# Patient Record
Sex: Female | Born: 1980 | Race: Black or African American | Hispanic: No | Marital: Married | Smoking: Never smoker
Health system: Southern US, Community
[De-identification: ages and names within clinical notes are randomized; demographics above are authoritative.]

## PROBLEM LIST (undated history)

## (undated) DIAGNOSIS — M549 Dorsalgia, unspecified: Secondary | ICD-10-CM

## (undated) DIAGNOSIS — D649 Anemia, unspecified: Secondary | ICD-10-CM

## (undated) DIAGNOSIS — Z8619 Personal history of other infectious and parasitic diseases: Secondary | ICD-10-CM

## (undated) DIAGNOSIS — B9689 Other specified bacterial agents as the cause of diseases classified elsewhere: Secondary | ICD-10-CM

## (undated) DIAGNOSIS — R300 Dysuria: Secondary | ICD-10-CM

## (undated) DIAGNOSIS — N760 Acute vaginitis: Secondary | ICD-10-CM

## (undated) DIAGNOSIS — O24419 Gestational diabetes mellitus in pregnancy, unspecified control: Secondary | ICD-10-CM

## (undated) DIAGNOSIS — L292 Pruritus vulvae: Secondary | ICD-10-CM

## (undated) HISTORY — DX: Acute vaginitis: N76.0

## (undated) HISTORY — DX: Pruritus vulvae: L29.2

## (undated) HISTORY — DX: Dorsalgia, unspecified: M54.9

## (undated) HISTORY — DX: Personal history of other infectious and parasitic diseases: Z86.19

## (undated) HISTORY — DX: Dysuria: R30.0

## (undated) HISTORY — PX: OTHER SURGICAL HISTORY: SHX169

## (undated) HISTORY — DX: Other specified bacterial agents as the cause of diseases classified elsewhere: B96.89

## (undated) HISTORY — DX: Gestational diabetes mellitus in pregnancy, unspecified control: O24.419

## (undated) HISTORY — DX: Anemia, unspecified: D64.9

---
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
## 1999-06-19 ENCOUNTER — Inpatient Hospital Stay (HOSPITAL_COMMUNITY): Admission: AD | Admit: 1999-06-19 | Discharge: 1999-06-19 | Payer: Self-pay | Admitting: Obstetrics

## 2000-08-27 ENCOUNTER — Inpatient Hospital Stay (HOSPITAL_COMMUNITY): Admission: AD | Admit: 2000-08-27 | Discharge: 2000-08-27 | Payer: Self-pay | Admitting: *Deleted

## 2000-08-27 ENCOUNTER — Encounter: Payer: Self-pay | Admitting: *Deleted

## 2001-05-14 ENCOUNTER — Encounter: Admission: RE | Admit: 2001-05-14 | Discharge: 2001-05-14 | Payer: Self-pay | Admitting: Obstetrics and Gynecology

## 2001-07-16 ENCOUNTER — Inpatient Hospital Stay (HOSPITAL_COMMUNITY): Admission: AD | Admit: 2001-07-16 | Discharge: 2001-07-19 | Payer: Self-pay | Admitting: Obstetrics and Gynecology

## 2001-09-13 ENCOUNTER — Inpatient Hospital Stay (HOSPITAL_COMMUNITY): Admission: AD | Admit: 2001-09-13 | Discharge: 2001-09-13 | Payer: Self-pay | Admitting: Obstetrics and Gynecology

## 2001-09-14 DIAGNOSIS — B9689 Other specified bacterial agents as the cause of diseases classified elsewhere: Secondary | ICD-10-CM

## 2001-09-14 HISTORY — DX: Other specified bacterial agents as the cause of diseases classified elsewhere: B96.89

## 2007-05-26 ENCOUNTER — Inpatient Hospital Stay (HOSPITAL_COMMUNITY): Admission: AD | Admit: 2007-05-26 | Discharge: 2007-05-26 | Payer: Self-pay | Admitting: Obstetrics and Gynecology

## 2008-04-16 DIAGNOSIS — D649 Anemia, unspecified: Secondary | ICD-10-CM

## 2008-04-16 HISTORY — DX: Anemia, unspecified: D64.9

## 2008-04-20 ENCOUNTER — Encounter: Admission: RE | Admit: 2008-04-20 | Discharge: 2008-04-20 | Payer: Self-pay | Admitting: Obstetrics and Gynecology

## 2008-06-12 ENCOUNTER — Inpatient Hospital Stay (HOSPITAL_COMMUNITY): Admission: AD | Admit: 2008-06-12 | Discharge: 2008-06-12 | Payer: Self-pay | Admitting: Obstetrics and Gynecology

## 2008-06-21 ENCOUNTER — Inpatient Hospital Stay (HOSPITAL_COMMUNITY): Admission: AD | Admit: 2008-06-21 | Discharge: 2008-06-21 | Payer: Self-pay | Admitting: Obstetrics and Gynecology

## 2008-07-15 DIAGNOSIS — O24419 Gestational diabetes mellitus in pregnancy, unspecified control: Secondary | ICD-10-CM

## 2008-07-15 HISTORY — DX: Gestational diabetes mellitus in pregnancy, unspecified control: O24.419

## 2008-08-09 ENCOUNTER — Encounter (INDEPENDENT_AMBULATORY_CARE_PROVIDER_SITE_OTHER): Payer: Self-pay | Admitting: Obstetrics and Gynecology

## 2008-08-09 ENCOUNTER — Inpatient Hospital Stay (HOSPITAL_COMMUNITY): Admission: AD | Admit: 2008-08-09 | Discharge: 2008-08-12 | Payer: Self-pay | Admitting: Obstetrics and Gynecology

## 2008-08-09 HISTORY — PX: TUBAL LIGATION: SHX77

## 2009-11-02 DIAGNOSIS — L292 Pruritus vulvae: Secondary | ICD-10-CM

## 2009-11-02 HISTORY — DX: Pruritus vulvae: L29.2

## 2010-07-26 LAB — CBC
HCT: 33.1 % — ABNORMAL LOW (ref 36.0–46.0)
Hemoglobin: 8.5 g/dL — ABNORMAL LOW (ref 12.0–15.0)
MCHC: 33.5 g/dL (ref 30.0–36.0)
MCV: 91.4 fL (ref 78.0–100.0)
Platelets: 187 10*3/uL (ref 150–400)
RBC: 2.75 MIL/uL — ABNORMAL LOW (ref 3.87–5.11)
RBC: 3.63 MIL/uL — ABNORMAL LOW (ref 3.87–5.11)
WBC: 11 10*3/uL — ABNORMAL HIGH (ref 4.0–10.5)
WBC: 9.9 10*3/uL (ref 4.0–10.5)

## 2010-07-26 LAB — RPR: RPR Ser Ql: NONREACTIVE

## 2010-07-26 LAB — GLUCOSE, CAPILLARY: Glucose-Capillary: 89 mg/dL (ref 70–99)

## 2010-07-27 LAB — URINALYSIS, ROUTINE W REFLEX MICROSCOPIC
Glucose, UA: NEGATIVE mg/dL
Nitrite: NEGATIVE
Specific Gravity, Urine: 1.025 (ref 1.005–1.030)
pH: 6 (ref 5.0–8.0)

## 2010-07-27 LAB — WET PREP, GENITAL
Clue Cells Wet Prep HPF POC: NONE SEEN
Trich, Wet Prep: NONE SEEN
Yeast Wet Prep HPF POC: NONE SEEN

## 2010-08-01 LAB — URINALYSIS, ROUTINE W REFLEX MICROSCOPIC
Bilirubin Urine: NEGATIVE
Hgb urine dipstick: NEGATIVE
Ketones, ur: NEGATIVE mg/dL
Protein, ur: NEGATIVE mg/dL
Specific Gravity, Urine: 1.015 (ref 1.005–1.030)
Urobilinogen, UA: 0.2 mg/dL (ref 0.0–1.0)

## 2010-08-01 LAB — STREP B DNA PROBE: Strep Group B Ag: NEGATIVE

## 2010-08-01 LAB — WET PREP, GENITAL

## 2010-08-01 LAB — FETAL FIBRONECTIN: Fetal Fibronectin: NEGATIVE

## 2010-08-29 NOTE — Op Note (Signed)
Marissa Wagner, Marissa Wagner               ACCOUNT NO.:  [id number]   MEDICAL RECORD NO.:  [id number]          PATIENT TYPE:  INP   LOCATION:  9128                          FACILITY:  [facility]   PHYSICIAN:  Crist Fat. Rivard, M.D. DATE OF BIRTH:  1980/09/02   DATE OF PROCEDURE:  DATE OF DISCHARGE:                               OPERATIVE REPORT   PREOPERATIVE DIAGNOSES:  Intrauterine pregnancy at 38 weeks and 2 days,  previous cesarean section, early labor, desire for sterilization,  gestational diabetes.   POSTOPERATIVE DIAGNOSES:  Intrauterine pregnancy at 38 weeks and 2 days,  previous cesarean section, early labor, desire for sterilization,  gestational diabetes.   ANESTHESIA:  Spinal.   PROCEDURE:  Repeat low transverse cesarean section with bilateral tubal  ligation.   SURGEON:  Crist Fat. Rivard, MD   ASSISTANT:  Renaldo Reel. Emilee Hero, CNM   ESTIMATED BLOOD LOSS:  700 mL.   PROCEDURE:  After being informed of the planned procedure with possible  complications including bleeding, infection, injury to bowel, bladder,  or ureters, as well as irreversibility of tubal ligation and failure  rate of 1 and 500, informed consent was obtained.  The patient was taken  to OR #1, given spinal anesthesia without any complication.  She was  placed in the dorsal decubitus position, pelvis tilted to the left,  prepped and draped in a sterile fashion, and a Foley catheter was  inserted in her bladder.  After assessing adequate level of anesthesia,  we infiltrated the previous incision with 20 mL of Marcaine 0.25 and  performed a Pfannenstiel incision which was brought down sharply to the  fascia.  The fascia was incised in a low transverse fashion.  Linea alba  was dissected.  Peritoneum was entered in the midline fashion.  An  Alexis retractor was placed easily.  At this time, we note significant  adhesion between the lower uterine segment and the bladder, but we were  able to proceed with a higher  incision still remaining on the lower  uterine segment away from the bladder flap.  This incision was done  sharply in a low transverse fashion first with knife, then extended  bluntly.  Amniotic fluid was clear.  We assisted the birth of a female  infant in vertex presentation in ROA at 9:16 a.m.  Mouth and nose were  suctioned.  Baby was delivered while reducing a nuchal cord over the  shoulder.  Cord was clamped with 2 Kelly clamps and sectioned and the  baby was given to Dr. Alison Murray, neonatologist, present in the room.  Blood  10 mL was drawn from the umbilical vein, and the placenta was allowed to  deliver spontaneously.  It was complete.  Cord had 3 vessels, and  uterine revision was negative.   We then proceeded with closure of the myometrium in 2 layers, first with  a running locked suture of 0 Vicryl, then with a Lembert suture of 0  Vicryl imbricating the first one.  Hemostasis was completed with 3  figure-of-eight stitches of 0 Vicryl on the left angle.   Both paracolic gutters  were cleaned.  Both tubes and ovaries assessed  and normal, and the pelvis was profusely irrigated with warm saline to  reveal a satisfactory hemostasis.  We then proceed with tubal ligation.  Starting with the left side, we isolated the tube onto Babcock forceps,  opened the mesosalpinx with cautery, doubly ligated the proximal stump  and doubly ligated the distal stump with 0 chromic.  A section of 1.5 cm  of the isthmic ampullary tube was then resected and both stumps were  cauterized.  Hemostasis was adequate on both sides.   We again verified hemostasis on the uterine incision which was adequate.  Retractors are removed.  Under fascia, hemostasis was completed with  cautery, and the fascia was closed with 2 running sutures of 0 Vicryl  meeting midline.  The wound was irrigated with warm saline.  Hemostasis  was completed with cautery, and the skin was closed with the  subcuticular suture of 3-0  Monocryl and Steri-Strips.   Instrument and sponge count was complete x2.  Estimated blood loss was  700 mL.  The procedure was well tolerated by the patient who was taken  to recovery room in a well and stable condition.   Little girl named, Ajaylah, was born at 9:16 a.m., weighed 5 pounds 10  ounces, and received an Apgar of 8 at 1 minute and 9 at 5 minutes.   SPECIMEN:  Placenta sent to [facility].      Crist Fat Rivard, M.D.  Electronically Signed     SAR/MEDQ  D:  08/09/2008  T:  08/10/2008  Job:  [id number]

## 2010-08-29 NOTE — H&P (Signed)
Marissa Wagner, Marissa Wagner               ACCOUNT NO.:  [id number]   MEDICAL RECORD NO.:  [id number]          PATIENT TYPE:  INP   LOCATION:  9128                          [facility]:  [facility]   PHYSICIAN:  Marissa Wagner, M.D. DATE OF BIRTH:  1980/08/26   DATE OF ADMISSION:  08/09/2008  DATE OF DISCHARGE:                              HISTORY & PHYSICAL   Marissa Wagner is a 30 year old gravida 5, para 1-0-3-1 at 38-2/7 weeks who  presented complaining of uterine contractions since approximately 2 a.m.  She denies leaking or bleeding and reports positive fetal movement.  She  is scheduled for repeat cesarean section and tubal ligation on Aug 14, 2008.  Pregnancy has been remarkable for:   1. Previous cesarean section with desire for repeat and tubal      ligation.  2. Gestational diabetes, diet controlled.  3. Group B strep negative.   PRENATAL LABORATORIES:  Blood type is O+, Rh antibody negative, VDRL  nonreactive, rubella titer positive, hepatitis B surface antigen  negative, HIV is nonreactive, GC and Chlamydia cultures were negative in  October.  Pap was normal in March 2009.  Hemoglobin upon entering the  practice was 12.5.  The patient had a normal first trimester screen.  She had a normal AFP.  She had an early Glucola at 19 weeks secondary to  history of gestational diabetes with her previous pregnancy.  This was  elevated at 143.  She had a 3-hour GTT at that time showing 3/4 abnormal  values.  At that time she was referred [facility]  and given a diagnosis of gestational diabetes..  She had a negative  group B strep noted at 36 weeks.   HISTORY OF PRESENT PREGNANCY:  The patient entered care at approximately  8 weeks.  She had an ultrasound at that time confirming [facility] of Aug 18, 2008 which was congruent with her LMP dating.  She was on some Phenergan  for nausea in early pregnancy.  She did have a first trimester screen  which was normal.  She had an AFP that  was normal.  She had an early  Glucola at 19 weeks secondary to history of gestational diabetes.  This  was elevated at 143 and she had 3/4 abnormal values on 3-hour GTT.  She  also had an ultrasound at 19 weeks showing normal growth and fluid with  normal cervical length.  From the early part of her pregnancy she was  determined to desire a repeat cesarean section.  She also wanted to have  a tubal at the same time.  The patient began to check her blood sugars  at approximately 23 weeks.  Fastings remained within normal limits and  her 2-hour p.c.'s were less than 120.  She had another ultrasound at 29  weeks with normal cervical length and fluid and growth at the 67th  percentile.  She was able to start checking her blood sugars every other  day by 29 weeks secondary to normal values.  She was seen for  contractions and questionable leaking at 29 weeks.  From that visit she  was noted to not be leaking, but she was having some preterm  contractions.  She was placed on Procardia p.r.n. but did not have to  take this very much.  Group B strep culture was done at 36 weeks and was  negative.  The patient was then scheduled for C-section on Aug 14, 2008  with a repeat with a tubal ligation.   OBSTETRICAL HISTORY:  In 2002 she had a miscarriage that resolved  spontaneously.  In 2003 she had a primary low transverse cesarean  section for a female infant, weight 6 pounds 1 ounce at 38 weeks.  She  was in labor for an unknown period of time.  This was apparently done  for failure to progress.  She had no complications.  She had gestational  diabetes with that pregnancy.  In 2004 she had a termination of  pregnancy.  In January 2009 she had a spontaneous miscarriage.  She was  positive for group B strep also with her second pregnancy.   MEDICAL HISTORY:  She was a previous Ortho Tri-Cyclen user, Ortho Evra  patch user.  Her Paps were normal.  She has no medication allergies.  She had a broken  arm and had surgery in 1992.  She had a C-section in  2003 and a D and C in 2004.   FAMILY HISTORY:  Her maternal grandmother had a heart attack, maternal  aunts have hypertension.  Maternal aunts are on oral meds for diabetes.  Maternal aunt had a stroke.  Maternal grandmother had breast cancer.  Maternal aunt has had breast cancer.  Genetic history is remarkable for  the patient's sister with a heart murmur.   SOCIAL HISTORY:  The patient is married to the father of the baby.  He  is involved and supportive.  His name is Murl Zogg, Montez Hageman.  The patient  is high school educated.  She is in customer service.  Her husband has  some college.  He is in Airline pilot at [employer].  She has been followed by the  physician service [facility].  She denies any alcohol, drug or  tobacco use during this pregnancy.   PHYSICAL EXAMINATION:  VITAL SIGNS:  Stable.  The patient is febrile.  HEENT:  Within normal limits.  LUNGS:  The breath sounds are clear.  HEART:  Regular rate and rhythm without murmur.  BREASTS:  Soft and nontender.  ABDOMEN:  Fundal height is approximately 38 -cm, estimated fetal weight  is 6-7 pounds.  Uterine contractions are every 3-4 minutes, mild to  moderate quality.  Cervical exam initially was 1-2 cm, 90% vertex, was  noted to be high.  Reevaluation approximately 1 to 1-1/2 hours later  showed 2 cm, 90%, with the presenting part out of the pelvis.  Vertex  presentation was verified by bedside ultrasound.  Fetal heart rate has  currently been reactive.  There have been cycles of mild variables with  contractions.  EXTREMITIES:  Deep tendon reflexes are 2+ without clonus.  There is a  trace edema noted.   IMPRESSION:  1. Intrauterine pregnancy at 38-2/7 weeks.  2. Early labor.  3. For repeat cesarean section and tubal sterilization on Aug 14, 2008.      Will plan to do this today.  4. Group B strep negative.   PLAN:  1. Admit to [facility] per consult  with Dr. Estanislado Pandy as      attending physician.  2.  Routine physician preoperative orders.  3. Reviewed risks and benefits of C-section and tubal ligation      including bleeding, infection, damage to other organs and failure      of tubal ligation.  The patient seems to understand these risks and      wished to proceed.      Renaldo Reel Emilee Hero, C.N.M.      Marissa Fat Wagner, M.D.  Electronically Signed    VLL/MEDQ  D:  08/09/2008  T:  08/09/2008  Job:  [id number]

## 2010-09-01 NOTE — Discharge Summary (Signed)
NAMECLARYSSA, SANDNER               ACCOUNT NO.:  [id number]   MEDICAL RECORD NO.:  [id number]          PATIENT TYPE:  INP   LOCATION:  9128                          [facility]ACILITY:  [facility]   PHYSICIAN:  Hal Morales, M.D.DATE OF BIRTH:  Sep 02, 1980   DATE OF ADMISSION:  08/09/2008  DATE OF DISCHARGE:  08/12/2008                               DISCHARGE SUMMARY   HISTORY OF PRESENT ILLNESS:  Ms. Loughridge is a 30 year old gravida 5, and  now para 2-0-3-2 who presented in active labor at 38-1/2 weeks and was  provided with a repeat cesarean and bilateral tubal ligation for the  birth of her daughter, Quay Burow on August 09, 2008 and she weighed 5 pounds  10 ounces and had Apgar of 8 at 1 minute and 9 at 5 minutes and was  delivered by Dr. Estanislado Pandy.   ADMISSION DIAGNOSES:  1. Intrauterine pregnancy at 38 weeks.  2. Previous cesarean.  3. Gestational diabetes mellitus.   DISCHARGE DIAGNOSES:  1. Repeat cesarean.  2. Bilateral tubal ligation.  3. Gestational diabetes mellitus.  4. Anemia.   PERTINENT LABORATORY DATA:  Ms. Armitage's blood type is O+.  She is  rubella immune.  She had a negative GBS status.  WBC 11.0 ( 9.9), HGB  8.5 (11.3), HCT 25.4 (33.1), PLT 150 (187).  Ms. Agyeman's fasting  glucose on the first postoperative day was 89.   PROCEDURES:  Ms. Lensing had been scheduled for a repeat cesarean and  bilateral tubal ligation on Aug 14, 2008; however, she presented on August 09, 2008 with regular uterine contractions and a cervix that was dilated  1-2 cm, 90% effaced and then changed to 2 cm dilation.  She was given  spinal anesthesia and a repeat low transverse cesarean section as well  as a bilateral tubal ligation with tissue from the right and left  fallopian tube sent to Pathology for analysis.  Ms. Leppla proceeded  well on the clinical pathway of care for postoperative patient and was  discharge on day 3, without any dizziness, and no problems voiding.  She  did have some trapped  gas in her abdomen.   PHYSICAL EXAMINATION:  VITAL SIGNS:  Her vital signs on day of discharge  were 97.5, pulse 78, respiratory rate 18, blood pressure 103/63, and O2  saturation 99% on room air.  LUNGS:  Clear to auscultation bilaterally.  HEART:  Regular rate and rhythm.  No murmur.  BREASTS:  Soft.  Nipples intact.  EXTREMITIES:  No edema of upper extremities, +1 edema of lower  extremities.  Normal DTRs.  Negative Homans x2.  ABDOMEN:  Her abdomen was noticeably distended, however, the fundus was  firm and 2 cm below the umbilicus.  PELVIC:  The uterine incision and Steri-Strips were intact with good  approximation.  There was light lochia from vagina.   She had stable orthostatic blood pressures and pulses, despite her  hemoglobin of 8.5 postoperatively.  She was provided with and reviewed  the discharge instruction booklet with special emphasis on the danger  signs of the postpartum and postoperative period.  DISCHARGE MEDICATIONS:  She was given prescriptions and her discharge  medications include.  1. Prenatal vitamins.  2. Iron.  3. Motrin 600 mg.  4. Percocet.  5. Over-the-counter stool softeners and/or fiber supplementation.   Constipation prevention was reviewed with Ms. Alferd Apa and she was  encouraged to consider taking a rectal suppository to help her with her  gas.  Her bowel movements good for discharge.  Ms. Strubel was discharged  in stable condition in good spirits and was deemed to have received full  benefit for the hospitalization of the birth of her daughter, Quay Burow on  August 09, 2008.      Eulogio Bear, CNM      Hal Morales, M.D.  Electronically Signed    JM/MEDQ  D:  08/14/2008  T:  08/14/2008  Job:  [id number]

## 2010-09-01 NOTE — Discharge Summary (Signed)
[facility]  Patient:    Marissa Wagner, Marissa Wagner Visit Number: [id number] MRN: [id number]          Service Type: OBS Location: 910A [id number] [id number] Attending Physician:  Shaune Spittle Dictated by:   Marcelle Smiling Clelia Croft, C.N.M. Admit Date:  07/16/2001 Discharge Date: 07/19/2001                             Discharge Summary  DATE OF BIRTH:                Oct 16, 1980.  ADMITTING DIAGNOSES:          1. Term intrauterine pregnancy.                               2. Nonreassuring fetal heart rate tracing.  DISCHARGE DIAGNOSES:          1. Term intrauterine pregnancy.                               2. Nonreassuring fetal heart rate tracing.                               3. Occiput posterior position.                               4. Nuchal cord.  PROCEDURE:                    1. Primary low transverse cesarean section.                               2. Epidural anesthesia.  HOSPITAL COURSE:              [name] is a 30 year old, gravida 2, para 0-0-1-0, at 38-6/7 weeks who was admitted for advanced cervical change with irregular uterine contractions and questionable prolonged leaking. This was on July 16, 2001. Her pregnancy has been remarkable for (1) positive group B strep, (2) diet-controlled gestational diabetes, (3) questionable last menstrual period. After admission, her membranes were artificially ruptured for clear fluid but foul odor present. Internal monitors were placed per Dr. Stefano Gaul. Her cervix continued to remain at 6 cm, which was unchanged, throughout the day until approximately 1 p.m. when fetal heart rate decelerations were present as well as tachycardia. A C-section was recommended, risks and benefits were reviewed and the decision was made to proceed with cesarean section. The patient tolerated the procedure well with the delivery of a viable female infant. Weight was 6 pounds 1 ounce. Apgars were 8 and 9. Infants name was Anniah  from an occiput posterior position. The infant was taken to the full-term nursery. Mom was taken to recovery in good condition.  By postoperative day #1, the patient was doing well. Physical exam was within normal limits. Incision was clean, dry and intact. The patients hemoglobin was 9.4; it had been 11.2 predelivery. She was tolerating a regular diet and the rest of the postoperative stay was unremarkable. She was bottle feeding and planned Ortho Evra patch for contraception. By postoperative day #3, she was deemed to have received the benefit of her hospital stay and  was discharged home.  DISCHARGE INSTRUCTIONS:       Instructions per [facility] handout.  DISCHARGE MEDICATIONS:        1. Motrin 600 mg p.o. q.6h. p.r.n.                               2. Tylox one to two p.o. q.3-4h. p.r.n.  DISCHARGE FOLLOWUP:           The patient is to follow up in six weeks at [facility]. Dictated by:   Marcelle Smiling Clelia Croft, C.N.M. Attending Physician:  Shaune Spittle DD:  07/19/01 TD:  07/20/01 Job: [id number]

## 2010-09-01 NOTE — Op Note (Signed)
[facility]  Patient:    Marissa Wagner, Marissa Wagner Visit Number: [id number] MRN: [id number]          Service Type: Attending:  Janine Limbo, M.D. Dictated by:   Janine Limbo, M.D. Proc. Date: 07/16/01                             Operative Report  PREOPERATIVE DIAGNOSES:       1. Term intrauterine pregnancy.                               2. Nonreassuring fetal heart rate tracing.  POSTOPERATIVE DIAGNOSES:      1. Term intrauterine pregnancy.                               2. Nonreassuring fetal heart rate tracing.                               3. Occiput posterior presentation.                               4. Nuchal cord.  PROCEDURE:                    Primary low transverse cesarean section.  SURGEON:                      Janine Limbo, M.D.  FIRST ASSISTANT:              Mack Guise, C.N.M.  ANESTHESIA:                   Epidural.  DISPOSITION:                  [name] is a 30 year old female gravida 2, para 0-0-1-0 who presents at term.  She labored and dilated her cervix to 6 cm.  Pitocin was started.  She was noted to have multiple decelerations.  An amnioinfusion was given but the decelerations did not resolve.  The patient then began having fetal tachycardia with a baseline fetal heart rate of approximately 180-190 beats per minute.  The patients cervix had stopped dilating at 6 cm.  The decision was made to recommend cesarean section.  The patient agreed with that recommendation.  The patient understood the indications for her procedure and she accepted the risks of, but not limited to, anesthetic complications, bleeding, infections, and possible damage to the surrounding organs.  FINDINGS:                     A 6 pound 1 ounce female infant (Anaya) was delivered from an occiput posterior presentation.  The Apgars were 8 at one minute and 9 at five minutes.  There was a nuchal cord present.  The uterus, fallopian  tubes, and the ovaries were normal for the gravid state.  PROCEDURE:                    The patient was taken to the operating room where it was determined that the epidural she had received for labor would be adequate for cesarean section.  A Foley catheter had previously been placed.  The patients abdomen was prepped with multiple layers of Betadine and then sterilely draped.  A low transverse incision was made in the abdomen and carried sharply through the subcutaneous tissue, the fascia, and the anterior peritoneum.  An incision was made in the lower uterine segment and extended transversely.  The fetal head was delivered without difficulty.  The mouth and nose were suctioned.  The remainder of the infant was delivered.  The cord was clamped and cut and the infant was handed to the awaiting pediatric team. Routine cord blood studies were obtained.  The placenta was removed.  The uterine cavity was cleaned of amniotic fluid, clotted blood, and membranes. Uterine cavity was irrigated.  The incision was closed using a running locking suture of 2-0 Vicryl.  The pericolonic gutters were cleaned of amniotic fluid and clotted blood.  Hemostasis was noted to be adequate throughout.  The peritoneal cavity was again vigorously irrigated.  All instruments were removed.  The anterior peritoneum and the abdominal musculature were reapproximated in the midline using 2-0 Vicryl.  The fascia was closed using a running suture of 0 Vicryl followed by three interrupted sutures of 3-0 Vicryl.  The subcutaneous layer was irrigated.  Hemostasis was adequate.  The subcutaneous layer was closed using a running suture of 2-0 Vicryl.  The skin was reapproximated using skin staples.  Sponge, needle, and instrument counts were correct on two occasions.  The estimated blood loss for the procedure was 700 cc.  The patient tolerated her procedure well.  The patient was taken to the recovery room in stable condition.   The infant was taken to the full-term nursery in stable condition. Dictated by:   Janine Limbo, M.D. Attending:  Janine Limbo, M.D. DD:  07/16/01 TD:  07/16/01 Job: [id number]

## 2010-09-01 NOTE — H&P (Signed)
[facility]  Patient:    Marissa Wagner, Marissa Wagner Visit Number: [id number] MRN: [id number]          Service Type: Attending:  Maris Berger. Pennie Rushing, M.D. Dictated by:   Nigel Bridgeman, C.N.M. Adm. Date:  07/16/01                           History and Physical  CHIEF COMPLAINT:              [name] is a 30 year old gravida 2 para 0, 0-1-0, at 38-6/7th weeks, who presents with questionable leaking and gush of fluid at approximately 3:30 a.m.  HISTORY OF PRESENT ILLNESS:   She reports irregular uterine contractions but less uncomfortable than previously over the last couple of days.  She has had several days of gradually increasing contractions.  Reports positive fetal movement.  Does report increased mucus discharge the last several days. Pregnancy has been remarkable for:                               1. Positive group B strep.                               2. Gestational diabetes, diet controlled, but                                  she has not checked her CBGs in approximately                                  two to three weeks secondary to "being also                                  low and my fingers hurting."                               3. Questionable last menstrual period, but                                  dating by 13 week ultrasound.  PRENATAL LABORATORY DATA:     Blood type is O-positive, Rh antibody negative. VDRL nonreactive.  Rubella titer positive.  Hepatitis B surface antigen negative.  HIV nonreactive.  Sickle cell test negative.  Pap smear normal. Initial glucose challenge was elevated, three hour GTT was also abnormal.  AFP was normal.  Hemoglobin upon entry into practice was 12, at 26 weeks was 10. EDC of July 24, 2001 was established by 13 week ultrasound and was in agreement with ultrasound at approximately 18 weeks.  Group B strep culture was positive at 36 weeks.  HISTORY OF PRESENT PREGNANCY: The patient entered care at  approximately 14 weeks.  She had had a history of ascus on Pap smear in June 2002.  Pap smear with HPV typing was done, which was normal and negative.  She had an ultrasound at 13 weeks and then had another ultrasound at 18 weeks, with normal findings.  She originally had planned CNM management; however, she  had an elevated one hour glucola and an abnormal three hour GTT.  She was referred to [facility] diet teaching and transferred to the physician service.  As of 33 weeks she had not done any CBGs and need to manage her blood sugar carefully was reviewed with the patient.  She had an ultrasound at 33 weeks which showed normal growth and fluid.  That was her only ultrasound in late pregnancy.  By the end of 35 weeks her fasting blood sugars were running between 65-75, two hour p.c.s were between 72-156.  Group B strep culture was positive.  She reports that she had not checked her blood sugar in the last two to three weeks.  PAST MEDICAL HISTORY:         1. She was on Ortho Tri-Cyclen until March 2002.                               2. She had ascus on Pap smear in June 2002 but                                  then had a normal Pap smear at her new OB                                  visit.                               3. She has had occasional yeast infections.                               4. Reports the usual childhood illnesses.                               5. At age 68 she broke her right arm.  ALLERGIES:                    No known medication allergies.  FAMILY HISTORY:               Maternal aunt had hypertension.  Paternal grandmother had insulin-dependent diabetes.  Maternal aunt had lung cancer. Maternal aunt also had a stroke.  GENETIC HISTORY:              Remarkable for the patients sister having a heart murmur.  SOCIAL HISTORY:               The patient is engaged to the father of the baby.  He is involved and supportive.  His name  is Bettyjane Shenoy, Montez Hageman.  The patient is high school educated.  She is employed in Clinical biochemist.  Her partner has one year of college and is employed with [employer].  She is African-American and of the [place] faith.  She originally was followed by the CNM service at [facility] but then was transferred to the physician service secondary to diagnosis of gestational diabetes.  She denies any alcohol, drug, or tobacco use during this pregnancy.  PHYSICAL EXAMINATION:  VITAL SIGNS:  Temperature 99.4 degrees.  Other vital signs are stable.  HEENT:                        Within normal limits.  LUNGS:                        Bilateral breath sounds clear.  HEART:                        Regular rate and rhythm without murmur.  BREAST:                       Soft, nontender.  ABDOMEN:                      Fundal height approximately 38 cm.  Abdomen is soft and nontender.  Electronic fetal monitoring reveals reassuring fetal heart rate with negative spontaneous NST, but there is noted to be decreased variability on external monitor.  There was a positive response to scalp stim during VE.  Uterine contractions are irregular, every two to six minutes; mild quality.  The patient was unaware of many uterine contractions.  PELVIC:                       Speculum examination showed positive frothy fluid in the vagina with a very slight questionable foul odor.  Nitrazine was positive.  Crist Fat was negative but there were multiple rbcs and wbcs on the fern slide, with possibility of obscuring ferning on the sample.  Cervix is 6 cm, 100%, vertex at -1 to 0 station, with bulging forewaters noted.  EXTREMITIES:                  Deep tendon reflexes are 2+ without clonus. There is trace edema noted.  IMPRESSION:                   1. Intrauterine pregnancy at 38-6/7th weeks.                               2. Advanced cervical change with irregular                                   uterine contraction quality and frequency.                               3. Questionable prolonged leaking.                                4. Gestational diabetes, diet controlled but                                  poor capillary blood glucose monitoring                                  history.  5. Positive group B streptococci.  PLAN:                         1. Admit to birthing suite per consult with                                  Dr. Dierdre Forth, attending physician.                               2. Routine physician orders.                               3. Plan serum glucose with admit laboratories.                                  If greater than 90 plan implementation of                                  gestational diabetes protocol.  If less                                  than 90, no separate orders are noted or                                  needed.                               4. Penicillin G per standard dosing. Dictated by:   Nigel Bridgeman, C.N.M. Attending:  Maris Berger. Pennie Rushing, M.D. DD:  07/16/01 TD:  07/16/01 Job: [id number]

## 2011-01-05 LAB — GC/CHLAMYDIA PROBE AMP, GENITAL
Chlamydia, DNA Probe: NEGATIVE
GC Probe Amp, Genital: NEGATIVE

## 2011-01-05 LAB — WET PREP, GENITAL: Yeast Wet Prep HPF POC: NONE SEEN

## 2011-09-03 ENCOUNTER — Encounter: Payer: Self-pay | Admitting: Obstetrics and Gynecology

## 2011-09-03 ENCOUNTER — Ambulatory Visit (INDEPENDENT_AMBULATORY_CARE_PROVIDER_SITE_OTHER): Payer: Managed Care, Other (non HMO) | Admitting: Obstetrics and Gynecology

## 2011-09-03 VITALS — BP 100/62 | Temp 98.4°F | Ht 63.0 in | Wt 130.0 lb

## 2011-09-03 DIAGNOSIS — D649 Anemia, unspecified: Secondary | ICD-10-CM

## 2011-09-03 DIAGNOSIS — B9689 Other specified bacterial agents as the cause of diseases classified elsewhere: Secondary | ICD-10-CM

## 2011-09-03 DIAGNOSIS — O24419 Gestational diabetes mellitus in pregnancy, unspecified control: Secondary | ICD-10-CM

## 2011-09-03 DIAGNOSIS — M549 Dorsalgia, unspecified: Secondary | ICD-10-CM | POA: Insufficient documentation

## 2011-09-03 DIAGNOSIS — A499 Bacterial infection, unspecified: Secondary | ICD-10-CM

## 2011-09-03 DIAGNOSIS — Z124 Encounter for screening for malignant neoplasm of cervix: Secondary | ICD-10-CM

## 2011-09-03 DIAGNOSIS — L292 Pruritus vulvae: Secondary | ICD-10-CM

## 2011-09-03 DIAGNOSIS — R300 Dysuria: Secondary | ICD-10-CM | POA: Insufficient documentation

## 2011-09-03 DIAGNOSIS — Z01419 Encounter for gynecological examination (general) (routine) without abnormal findings: Secondary | ICD-10-CM

## 2011-09-03 DIAGNOSIS — L293 Anogenital pruritus, unspecified: Secondary | ICD-10-CM

## 2011-09-03 DIAGNOSIS — N760 Acute vaginitis: Secondary | ICD-10-CM

## 2011-09-03 DIAGNOSIS — Z8619 Personal history of other infectious and parasitic diseases: Secondary | ICD-10-CM

## 2011-09-03 NOTE — Progress Notes (Signed)
Subjective:    Marissa Wagner is a 31 y.o. female, [phone], who presents for an annual exam. The patient reports no problems.  Menstrual cycle:   LMP: Patient's last menstrual period was 08/03/2011.           flow is moderate and Cycle is monthly with normal flow                                  and without intermenstrual bleeding or severe dysmenorrhea  Review of Systems Pertinent items are noted in HPI. Denies pelvic pain, uti symptoms, vaginitis symptoms, irregular bleeding, menopausal symptoms, change in bowel habits or rectal bleeding   Objective:    BP 100/62  Temp 98.4 F (36.9 C)  Ht 5\' 3"  (1.6 m)  Wt 130 lb (58.968 kg)  BMI 23.03 kg/m2  LMP 08/03/2011    Wt Readings from Last 1 Encounters:  09/03/11 130 lb (58.968 kg)   BMI: Body mass index is 23.03 kg/(m^2). General Appearance: Alert, appropriate appearance for age. No acute distress HEENT: Grossly normal Neck / Thyroid: Supple, no thyromegaly or cervical adenopathy Lungs: clear to auscultation bilaterally Back: No CVA tenderness Breast Exam: No masses or nodes.No dimpling, nipple retraction or discharge. Cardiovascular: Regular rate and rhythm.  Gastrointestinal: Soft, non-tender, no masses or organomegaly Pelvic Exam: EGBUS-wnl, vagina-normal rugae, cervix- without lesions or tenderness, uterus appears normal size shape and consistency, adnexae-no masses or tenderness Rectovaginal: no masses and normal sphincter tone Lymphatic Exam: Non-palpable nodes in neck, clavicular,  axillary, or inguinal regions  Skin: no rashes or abnormalities Extremities: no clubbing cyanosis or edema  Neurologic: grossly normal Psychiatric: Alert and oriented, appropriate affect.  Assessment:   Routine GYN Exam   Plan:    PAP sent RTO 1 year or prn  Lilliahna Schubring,ELMIRAPA-C

## 2011-09-03 NOTE — Progress Notes (Signed)
Regular Periods: yes Mammogram: no  Monthly Breast Ex.: yes Exercise: yes  Tetanus < 10 years: no Seatbelts: yes  NI. Bladder Functn.: yes Abuse at home: no  Daily BM's: no Stressful Work: yes  Healthy Diet: yes Sigmoid-Colonoscopy: no  Calcium: no Medical problems this year: no problems   LAST PAP:2012 nl  Contraception: btl  Mammogram:  no  PCP: NO   PMH: NO CHANGE  FMH: NO CHANGE  Last Bone Scan: NO

## 2011-09-05 LAB — PAP IG W/ RFLX HPV ASCU

## 2012-04-10 ENCOUNTER — Inpatient Hospital Stay (HOSPITAL_COMMUNITY)
Admission: AD | Admit: 2012-04-10 | Discharge: 2012-04-10 | Disposition: A | Discharge status: Home / Self Care | Payer: Managed Care, Other (non HMO) | Source: Ambulatory Visit | Attending: Obstetrics and Gynecology | Admitting: Obstetrics and Gynecology

## 2012-04-10 ENCOUNTER — Encounter (HOSPITAL_COMMUNITY): Payer: Self-pay | Admitting: *Deleted

## 2012-04-10 DIAGNOSIS — J069 Acute upper respiratory infection, unspecified: Secondary | ICD-10-CM

## 2012-04-10 DIAGNOSIS — R05 Cough: Secondary | ICD-10-CM | POA: Insufficient documentation

## 2012-04-10 DIAGNOSIS — R079 Chest pain, unspecified: Secondary | ICD-10-CM

## 2012-04-10 DIAGNOSIS — R059 Cough, unspecified: Secondary | ICD-10-CM | POA: Insufficient documentation

## 2012-04-10 MED ORDER — GUAIFENESIN-CODEINE 100-10 MG/5ML PO SYRP: 5.0000 mL | ORAL_SOLUTION | Freq: Three times a day (TID) | ORAL | Status: DC | PRN

## 2012-04-10 NOTE — MAU Note (Signed)
Patient states she had a cold around Thanksgiving with a cough. States the cough persists and she started having chest pain with the cough about 3 days ago. Patient indicates pain is mid sternal.

## 2012-04-10 NOTE — Progress Notes (Signed)
History  Marissa Wagner is a 31 y.o. [id number] at Unknown   Chief Complaint  Patient presents with  . Chest Pain  cold around Thanksgiving with a cough. States the cough persists and she started having chest pain with the cough about 3 days ago, no fever or body aches with c/o of nasal stuffiness, no sore throat, no ear pain. Patient indicates pain is mid sternal    Unknown   Chief Complaint  Patient presents with  . Chest Pain   @SFHPI @  Prior to Admission medications   Medication Sig Start Date End Date Taking? Authorizing Provider  naproxen sodium (ANAPROX) 220 MG tablet Take 220 mg by mouth 2 (two) times daily as needed. For headache   Yes Historical Provider, MD  guaiFENesin-codeine (ROBITUSSIN AC) 100-10 MG/5ML syrup Take 5 mLs by mouth 3 (three) times daily as needed for cough. 04/10/12   Lavera Guise, CNM    Patient Active Problem List  Diagnosis  . Gestational diabetes  . Anemia  . H/O varicella  . Dysuria  . Back pain  . Vulvar pruritus  . BV (bacterial vaginosis)   Vitals:  Blood pressure 113/66, pulse 93, temperature 98 F (36.7 C), temperature source Oral, resp. rate 18, height 5\' 2"  (1.575 m), weight 138 lb 3.2 oz (62.687 kg), last menstrual period 03/27/2012, SpO2 100.00%. OB History    Grav Para Term Preterm Abortions TAB SAB Ect Mult Living   5 2 2  3 1 2   2       Past Medical History  Diagnosis Date  . Gestational diabetes 07/2008  . Anemia 2010    During pregnancy  . H/O varicella   . Dysuria   . Back pain   . 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 BV (bacterial vaginosis) 09/2001    Past Surgical History  Procedure Date  . Tubal ligation 08/09/2008    Bilateral   . Broken arm   . Other surgical history     ARM SURGERY    Family History  Problem Relation Age of Onset  . Cancer Maternal Grandmother     breast  . Heart disease Maternal Grandmother     History  Substance Use Topics  . Smoking status: Never Smoker   . Smokeless tobacco: Never  Used  . Alcohol Use: No    Allergies: No Known Allergies  Prescriptions prior to admission  Medication Sig Dispense Refill  . naproxen sodium (ANAPROX) 220 MG tablet Take 220 mg by mouth 2 (two) times daily as needed. For headache        @ROS @ Physical Exam   Blood pressure 113/66, pulse 93, temperature 98 F (36.7 C), temperature source Oral, resp. rate 18, height 5\' 2"  (1.575 m), weight 138 lb 3.2 oz (62.687 kg), last menstrual period 03/27/2012, SpO2 100.00%.  @PHYSEXAMBYAGE2 @ Labs:  No results found for this or any previous visit (from the past 24 hour(s)). ASSESSMENT: Patient Active Problem List  Diagnosis  . Gestational diabetes  . Anemia  . H/O varicella  . Dysuria  . Back pain  . Vulvar pruritus  . BV (bacterial vaginosis)   Physical Examination:Physical exam: Calm, no distress, HEENT with exception nasal turbinates edematous, wnl lungs clear bilaterally, AP RRR,  ED Course  Assessment/Plan URI tussionex discussed nettie pot, tylenol, increase po fluids, naproxen for sternum discomfort. F/o with PCP if unresolved discussed. Lavera Guise, CNM

## 2012-04-18 ENCOUNTER — Emergency Department (HOSPITAL_COMMUNITY): Payer: Managed Care, Other (non HMO)

## 2012-04-18 ENCOUNTER — Encounter (HOSPITAL_COMMUNITY): Payer: Self-pay | Admitting: *Deleted

## 2012-04-18 ENCOUNTER — Emergency Department (HOSPITAL_COMMUNITY)
Admission: EM | Admit: 2012-04-18 | Discharge: 2012-04-18 | Disposition: A | Discharge status: Home / Self Care | Payer: Managed Care, Other (non HMO) | Attending: Emergency Medicine | Admitting: Emergency Medicine

## 2012-04-18 DIAGNOSIS — Z8632 Personal history of gestational diabetes: Secondary | ICD-10-CM | POA: Insufficient documentation

## 2012-04-18 DIAGNOSIS — J069 Acute upper respiratory infection, unspecified: Secondary | ICD-10-CM

## 2012-04-18 DIAGNOSIS — Z862 Personal history of diseases of the blood and blood-forming organs and certain disorders involving the immune mechanism: Secondary | ICD-10-CM | POA: Insufficient documentation

## 2012-04-18 DIAGNOSIS — Z87448 Personal history of other diseases of urinary system: Secondary | ICD-10-CM | POA: Insufficient documentation

## 2012-04-18 DIAGNOSIS — R059 Cough, unspecified: Secondary | ICD-10-CM | POA: Insufficient documentation

## 2012-04-18 DIAGNOSIS — R05 Cough: Secondary | ICD-10-CM | POA: Insufficient documentation

## 2012-04-18 DIAGNOSIS — Z8619 Personal history of other infectious and parasitic diseases: Secondary | ICD-10-CM | POA: Insufficient documentation

## 2012-04-18 DIAGNOSIS — R0602 Shortness of breath: Secondary | ICD-10-CM | POA: Insufficient documentation

## 2012-04-18 LAB — CBC
MCH: 31.2 pg (ref 26.0–34.0)
MCHC: 33 g/dL (ref 30.0–36.0)
MCV: 94.6 fL (ref 78.0–100.0)
Platelets: 255 10*3/uL (ref 150–400)
RBC: 4.07 MIL/uL (ref 3.87–5.11)

## 2012-04-18 LAB — POCT I-STAT TROPONIN I: Troponin i, poc: 0 ng/mL (ref 0.00–0.08)

## 2012-04-18 LAB — BASIC METABOLIC PANEL
CO2: 27 mEq/L (ref 19–32)
Calcium: 9.5 mg/dL (ref 8.4–10.5)
Creatinine, Ser: 0.84 mg/dL (ref 0.50–1.10)
GFR calc non Af Amer: >90 mL/min (ref >90)
Glucose, Bld: 82 mg/dL (ref 70–99)

## 2012-04-18 MED ORDER — IPRATROPIUM BROMIDE 0.02 % IN SOLN
0.5000 mg | Freq: Once | RESPIRATORY_TRACT | Status: AC
  Administered 2012-04-18: 0.5 mg via RESPIRATORY_TRACT
  Filled 2012-04-18: qty 2.5

## 2012-04-18 MED ORDER — ALBUTEROL SULFATE HFA 108 (90 BASE) MCG/ACT IN AERS: 2.0000 | INHALATION_SPRAY | Freq: Four times a day (QID) | RESPIRATORY_TRACT | Status: DC | PRN

## 2012-04-18 MED ORDER — ALBUTEROL SULFATE (5 MG/ML) 0.5% IN NEBU
2.5000 mg | INHALATION_SOLUTION | Freq: Once | RESPIRATORY_TRACT | Status: AC
  Administered 2012-04-18: 2.5 mg via RESPIRATORY_TRACT
  Filled 2012-04-18: qty 20

## 2012-04-18 MED ORDER — BENZONATATE 100 MG PO CAPS: 100.0000 mg | ORAL_CAPSULE | Freq: Three times a day (TID) | ORAL | Status: DC

## 2012-04-18 NOTE — ED Provider Notes (Signed)
History     CSN: [id number]  Arrival date & time 04/18/12  1421   None     Chief Complaint  Patient presents with  . Chest Pain     HPI chief complaint URI type symptoms. Of note is that his chest pain in chief complaint and nurse's notes the patient states this is a very minor complaint and only rarely present. Onset of URI symptoms 2 weeks ago. Location: Chest. Improved with cough syrup. Timing intermittent. No associated fevers sinus pressure, sinus pain, ear pain, sore throat, hemoptysis, productive cough, vomiting, diarrhea. Patient has rare intermittent chest discomfort and shortness of breath with coughing episodes. Dry cough only. No lower extremity edema or pain. For social history see nurse's notes. Family history myocardial infarction above the age of 73. Have reviewed the patient's past medical, past surgical, social history as well as medications and allergies.  Past Medical History  Diagnosis Date  . Gestational diabetes 07/2008  . Anemia 2010    During pregnancy  . H/O varicella   . Dysuria   . Back pain   . 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 BV (bacterial vaginosis) 09/2001    Past Surgical History  Procedure Date  . Tubal ligation 08/09/2008    Bilateral   . Broken arm   . Other surgical history     ARM SURGERY    Family History  Problem Relation Age of Onset  . Cancer Maternal Grandmother     breast  . Heart disease Maternal Grandmother     History  Substance Use Topics  . Smoking status: Never Smoker   . Smokeless tobacco: Never Used  . Alcohol Use: No    OB History    Grav Para Term Preterm Abortions TAB SAB Ect Mult Living   5 2 2  3 1 2   2       Review of Systems 10 Systems reviewed and are negative for acute change except as noted in the HPI.  Allergies  Review of patient's allergies indicates no known allergies.  Home Medications   Current Outpatient Rx  Name  Route  Sig  Dispense  Refill  . IBUPROFEN 200 MG PO TABS   Oral   Take 200 mg  by mouth every 6 (six) hours as needed. For pain/fever           BP 125/76  Pulse 91  Temp 98.3 F (36.8 C) (Oral)  Resp 20  SpO2 100%  LMP 03/27/2012  Physical Exam  Constitutional: She is oriented to person, place, and time. She appears well-developed and well-nourished. No distress.  HENT:  Head: Normocephalic and atraumatic.  Eyes: Conjunctivae normal are normal. Right eye exhibits no discharge. Left eye exhibits no discharge. No scleral icterus.  Neck: Normal range of motion. Neck supple. No JVD present.  Cardiovascular: Normal rate, regular rhythm, normal heart sounds and intact distal pulses.   No murmur heard. Pulmonary/Chest: Effort normal and breath sounds normal. No respiratory distress. She has no wheezes. She has no rales. She exhibits no tenderness.  Abdominal: Soft. Bowel sounds are normal. She exhibits no distension and no mass. There is no tenderness. There is no rebound and no guarding.  Musculoskeletal: Normal range of motion. She exhibits no edema and no tenderness.  Neurological: She is alert and oriented to person, place, and time.  Skin: Skin is warm and dry. She is not diaphoretic.  Psychiatric: She has a normal mood and affect.    ED Course  Procedures (  including critical care time)   Labs Reviewed  CBC  BASIC METABOLIC PANEL  POCT I-STAT TROPONIN I  D-DIMER, QUANTITATIVE   Dg Chest 2 View  04/18/2012  *RADIOLOGY REPORT*  Clinical Data: Chest pain.  Short of breath.  Dizziness.  CHEST - 2 VIEW  Comparison: None.  Findings: Heart size is normal.  Mediastinal shadows are normal. The lungs are clear.  The vascularity is normal.  No effusions.  No bony abnormalities.  IMPRESSION: Normal chest   Original Report Authenticated By: Paulina Fusi, M.D.      1. Viral upper respiratory tract infection with cough       MDM  Patient is a very well-appearing 32 year old African American female with no cardiac risk factors other than a family history  myocardial infarction over age 82 and no risk factors for pulmonary embolism who presents today with URI type symptoms with coughing and rare intermittent discomfort across the chest. No pain on exam. Vital stable. Afebrile. Troponin was sent from triage noted unremarkable. Given rare intermittent chest pain a pulmonary embolism is felt to be unlikely at this time. D-dimer sent given heart rate of 101. D-dimer negative. I feel acute coronary syndrome and pulmonary embolism extremely unlikely at this time. Symptoms much improved after a breathing treatment. Likely viral URI with some bronchoconstriction component. Discharge home with PCP followup and as necessary albuterol.        Consuello Masse, MD 04/19/12 [phone]

## 2012-04-18 NOTE — ED Notes (Signed)
Pt complaining of pain in chest after coughing.  Pt states she has had chest cold for a few days.  Pt in NAD, respirations even and unlabored.

## 2012-04-18 NOTE — ED Notes (Signed)
Pt reports chest pain and SOB since Dec 26th. Seen at [facility] and d/c'd with script for cough syrup. States Dec 28th states started having numbness and tingling across substernal chest. States pain makes her SOB. Denies diaphoresis.

## 2012-04-18 NOTE — ED Provider Notes (Signed)
32 year old female has had a cough and dyspnea for about 2 weeks. She had been seen at [facility] hospital and given some cough syrup and her cough is generally improving, but she is still complaining of dyspnea. Symptoms are somewhat intermittent and nothing consistently brings the dyspnea on. On exam, lungs are clear heart has regular rate and rhythm. Her chest x-ray and ECG are unremarkable. She will be given a therapeutic trial of albuterol with Atrovent.   Date: 04/18/2012  Rate: 101  Rhythm: sinus tachycardia  QRS Axis: normal  Intervals: normal  ST/T Wave abnormalities: normal  Conduction Disutrbances:none  Narrative Interpretation: Borderline sinus tachycardia, otherwise normal ECG. No prior ECG available for comparison.  Old EKG Reviewed: none available  I saw and evaluated the patient, reviewed the resident's note and I agree with the findings and plan.   Dione Booze, MD 04/19/12 2330

## 2012-04-18 NOTE — ED Notes (Signed)
Triage charting done by this RN not Tawana Scale.

## 2014-02-15 ENCOUNTER — Encounter (HOSPITAL_COMMUNITY): Payer: Self-pay | Admitting: *Deleted

## 2014-02-18 ENCOUNTER — Ambulatory Visit (INDEPENDENT_AMBULATORY_CARE_PROVIDER_SITE_OTHER): Payer: Managed Care, Other (non HMO) | Admitting: Family Medicine

## 2014-02-18 VITALS — BP 112/64 | HR 83 | Temp 98.5°F | Resp 16 | Ht 62.5 in | Wt 150.0 lb

## 2014-02-18 DIAGNOSIS — N760 Acute vaginitis: Secondary | ICD-10-CM

## 2014-02-18 DIAGNOSIS — A499 Bacterial infection, unspecified: Secondary | ICD-10-CM

## 2014-02-18 DIAGNOSIS — R300 Dysuria: Secondary | ICD-10-CM

## 2014-02-18 DIAGNOSIS — N898 Other specified noninflammatory disorders of vagina: Secondary | ICD-10-CM

## 2014-02-18 DIAGNOSIS — B9689 Other specified bacterial agents as the cause of diseases classified elsewhere: Secondary | ICD-10-CM

## 2014-02-18 LAB — POCT WET PREP WITH KOH
KOH Prep POC: NEGATIVE
RBC WET PREP PER HPF POC: NEGATIVE
Trichomonas, UA: NEGATIVE
YEAST WET PREP PER HPF POC: NEGATIVE

## 2014-02-18 LAB — POCT URINALYSIS DIPSTICK
BILIRUBIN UA: NEGATIVE
GLUCOSE UA: NEGATIVE
KETONES UA: NEGATIVE
Leukocytes, UA: NEGATIVE
Nitrite, UA: NEGATIVE
PH UA: 6
Protein, UA: NEGATIVE
RBC UA: NEGATIVE
SPEC GRAV UA: 1.015
Urobilinogen, UA: 0.2

## 2014-02-18 LAB — POCT UA - MICROSCOPIC ONLY
BACTERIA, U MICROSCOPIC: NEGATIVE
Casts, Ur, LPF, POC: NEGATIVE
Crystals, Ur, HPF, POC: NEGATIVE
MUCUS UA: NEGATIVE
Yeast, UA: NEGATIVE

## 2014-02-18 LAB — HEPATITIS C ANTIBODY: HCV AB: NEGATIVE

## 2014-02-18 LAB — RPR

## 2014-02-18 LAB — HIV ANTIBODY (ROUTINE TESTING W REFLEX): HIV 1&2 Ab, 4th Generation: NONREACTIVE

## 2014-02-18 MED ORDER — METRONIDAZOLE 500 MG PO TABS: 500.0000 mg | ORAL_TABLET | Freq: Two times a day (BID) | ORAL | Status: DC

## 2014-02-18 NOTE — Progress Notes (Signed)
Subjective:    Patient ID: Marissa Wagner, female    DOB: 09/29/1980, 33 y.o.   MRN: [id number] This chart was scribed for Norberto SorensonEva Shaw, MD by Jolene Provostobert Halas, Medical Scribe. This patient was seen in Room 9 and the patient's care was started at 8:48 AM.  Chief Complaint  Patient presents with  . Dysuria    with strong odor x 3 days  . Vaginal Discharge    HPI Past Medical History  Diagnosis Date  . Gestational diabetes 07/2008  . Anemia 2010    During pregnancy  . H/O varicella   . Dysuria   . Back pain   . 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 BV (bacterial vaginosis) 09/2001   No Known Allergies No current outpatient prescriptions on file prior to visit.   No current facility-administered medications on file prior to visit.   HPI Comments: Marissa Wagner is a 33 y.o. female with a past hx of tubal ligation and bacterial vaginosis who [facility] complaining of white vaginal discharge with associated strong smell that started three days ago. Pt endorses abdominal pain, HA, pelvic pain, and body aches as associated symptoms. Pt denies fever, chills, nausea and vomiting. Pt is sexually active and would like an STI screen. [facility] [place] [facility]    Review of Systems  Constitutional: Negative for fever and chills.  Gastrointestinal: Negative for nausea and vomiting.  Genitourinary: Positive for vaginal discharge and pelvic pain.  Musculoskeletal: Positive for myalgias.  Neurological: Positive for headaches.       Objective:  BP 112/64 mmHg  Pulse 83  Temp(Src) 98.5 F (36.9 C) (Oral)  Resp 16  Ht 5' 2.5" (1.588 m)  Wt 150 lb (68.04 kg)  BMI 26.98 kg/m2  SpO2 100%  LMP 01/19/2014  Physical Exam  Constitutional: She is oriented to person, place, and time. She appears well-developed and well-nourished.  HENT:  Head: Normocephalic and atraumatic.  Eyes: Pupils are equal, round, and reactive to light.  Neck: No JVD present. No thyromegaly  present.  Cardiovascular: Normal rate, regular rhythm and normal heart sounds.   Pulmonary/Chest: Effort normal and breath sounds normal. No respiratory distress.  Abdominal: There is no CVA tenderness.  Genitourinary:  Normal labia, normal vagina wit ha moderate about of frothy white discharge. Amine odor present. Cervix is normal without motion, tenderness or friability. Uterus and adnexa are normal to palpation.  Neurological: She is alert and oriented to person, place, and time.  Skin: Skin is warm and dry.  Psychiatric: She has a normal mood and affect. Her behavior is normal.  Nursing note and vitals reviewed.      Results for orders placed or performed in visit on 02/18/14  POCT UA - Microscopic Only  Result Value Ref Range   WBC, Ur, HPF, POC 0-1    RBC, urine, microscopic 0-1    Bacteria, U Microscopic neg    Mucus, UA neg    Epithelial cells, urine per micros 1-2    Crystals, Ur, HPF, POC neg    Casts, Ur, LPF, POC neg    Yeast, UA neg   POCT urinalysis dipstick  Result Value Ref Range   Color, UA light yellow    Clarity, UA clear    Glucose, UA neg    Bilirubin, UA neg    Ketones, UA neg    Spec Grav, UA 1.015    Blood, UA neg    pH, UA 6.0  Protein, UA neg    Urobilinogen, UA 0.2    Nitrite, UA neg    Leukocytes, UA Negative   POCT Wet Prep with KOH  Result Value Ref Range   Trichomonas, UA Negative    Clue Cells Wet Prep HPF POC 75%    Epithelial Wet Prep HPF POC 3-5    Yeast Wet Prep HPF POC neg    Bacteria Wet Prep HPF POC 1+    RBC Wet Prep HPF POC neg    WBC Wet Prep HPF POC 1-3    KOH Prep POC Negative     Assessment & Plan:   Dysuria - Plan: POCT UA - Microscopic Only, POCT urinalysis dipstick, POCT Wet Prep with KOH, HIV antibody, Hepatitis C antibody, RPR, GC/Chlamydia Probe Amp - UA neg/normal - pt reassured  Vaginal discharge - Plan: POCT Wet Prep with KOH, HIV antibody, Hepatitis C antibody, RPR, GC/Chlamydia Probe Amp   Bacterial  vaginosis - reviewed techniques and hygiene to prevent recurrence.  Meds ordered this encounter  Medications  . metroNIDAZOLE (FLAGYL) 500 MG tablet    Sig: Take 1 tablet (500 mg total) by mouth 2 (two) times daily with a meal. DO NOT CONSUME ALCOHOL WHILE TAKING THIS MEDICATION.    Dispense:  14 tablet    Refill:  0    I personally performed the services described in this documentation, which was scribed in my presence. The recorded information has been reviewed and considered, and addended by me as needed.  Norberto SorensonEva Shaw, MD MPH

## 2014-02-18 NOTE — Patient Instructions (Signed)
Bacterial Vaginosis Bacterial vaginosis is a vaginal infection that occurs when the normal balance of bacteria in the vagina is disrupted. It results from an overgrowth of certain bacteria. This is the most common vaginal infection in women of childbearing age. Treatment is important to prevent complications, especially in pregnant women, as it can cause a premature delivery. CAUSES  Bacterial vaginosis is caused by an increase in harmful bacteria that are normally present in smaller amounts in the vagina. Several different kinds of bacteria can cause bacterial vaginosis. However, the reason that the condition develops is not fully understood. RISK FACTORS Certain activities or behaviors can put you at an increased risk of developing bacterial vaginosis, including:  Having a new sex partner or multiple sex partners.  Douching.  Using an intrauterine device (IUD) for contraception. Women do not get bacterial vaginosis from toilet seats, bedding, swimming pools, or contact with objects around them. SIGNS AND SYMPTOMS  Some women with bacterial vaginosis have no signs or symptoms. Common symptoms include:  Grey vaginal discharge.  A fishlike odor with discharge, especially after sexual intercourse.  Itching or burning of the vagina and vulva.  Burning or pain with urination. DIAGNOSIS  Your health care provider will take a medical history and examine the vagina for signs of bacterial vaginosis. A sample of vaginal fluid may be taken. Your health care provider will look at this sample under a microscope to check for bacteria and abnormal cells. A vaginal pH test may also be done.  TREATMENT  Bacterial vaginosis may be treated with antibiotic medicines. These may be given in the form of a pill or a vaginal cream. A second round of antibiotics may be prescribed if the condition comes back after treatment.  HOME CARE INSTRUCTIONS   Only take over-the-counter or prescription medicines as  directed by your health care provider.  If antibiotic medicine was prescribed, take it as directed. Make sure you finish it even if you start to feel better.  Do not have sex until treatment is completed.  Tell all sexual partners that you have a vaginal infection. They should see their health care provider and be treated if they have problems, such as a mild rash or itching.  Practice safe sex by using condoms and only having one sex partner. SEEK MEDICAL CARE IF:   Your symptoms are not improving after 3 days of treatment.  You have increased discharge or pain.  You have a fever. MAKE SURE YOU:   Understand these instructions.  Will watch your condition.  Will get help right away if you are not doing well or get worse. FOR MORE INFORMATION  Centers for Disease Control and Prevention, Division of STD Prevention: www.cdc.gov/std American Sexual Health Association (ASHA): www.ashastd.org  Document Released: 04/02/2005 Document Revised: 01/21/2013 Document Reviewed: 11/12/2012 ExitCare Patient Information 2015 [employer]. This information is not intended to replace advice given to you by your health care provider. Make sure you discuss any questions you have with your health care provider.  

## 2014-02-19 LAB — GC/CHLAMYDIA PROBE AMP
CT Probe RNA: NEGATIVE
GC PROBE AMP APTIMA: NEGATIVE

## 2014-03-01 ENCOUNTER — Telehealth: Payer: Self-pay | Admitting: Radiology

## 2014-03-01 ENCOUNTER — Encounter: Payer: Self-pay | Admitting: Family Medicine

## 2014-03-01 NOTE — Telephone Encounter (Signed)
All negative, letter sent.

## 2014-03-01 NOTE — Telephone Encounter (Signed)
Pt calling about lab results. Labs haven't been reviewed yet, please review. Thanks.

## 2014-03-01 NOTE — Telephone Encounter (Signed)
LMOM letting her know her labs were normal and a letter was sent.

## 2014-03-08 ENCOUNTER — Ambulatory Visit (INDEPENDENT_AMBULATORY_CARE_PROVIDER_SITE_OTHER): Payer: Managed Care, Other (non HMO) | Admitting: Family Medicine

## 2014-03-08 VITALS — BP 122/80 | HR 99 | Temp 98.0°F | Resp 16 | Ht 63.0 in | Wt 150.0 lb

## 2014-03-08 DIAGNOSIS — K143 Hypertrophy of tongue papillae: Secondary | ICD-10-CM

## 2014-03-08 DIAGNOSIS — N898 Other specified noninflammatory disorders of vagina: Secondary | ICD-10-CM

## 2014-03-08 LAB — POCT WET PREP WITH KOH
KOH PREP POC: NEGATIVE
RBC Wet Prep HPF POC: NEGATIVE
TRICHOMONAS UA: NEGATIVE
YEAST WET PREP PER HPF POC: NEGATIVE

## 2014-03-08 MED ORDER — FLUCONAZOLE 150 MG PO TABS: 150.0000 mg | ORAL_TABLET | Freq: Once | ORAL | Status: DC

## 2014-03-08 NOTE — Patient Instructions (Signed)
Your lab came back negative for yeast infection, but I still feel treatment is appropriate at this time. Your symptoms sound like a yeast infection and you've recently been on flagyl which can increase your risk of getting a yeast infection.  Please take the 150mg  tab once. If you are still having symptoms in one week please take the 2nd tab at that time. Please return to clinic if this does not resolve your symptoms.

## 2014-03-08 NOTE — Progress Notes (Signed)
Subjective:    Patient ID: Marissa Wagner, female    DOB: 09/10/1980, 33 y.o.   MRN: [id number]  Michael LitterILLARD,NAIMA A, MD  Chief Complaint  Patient presents with  . Vaginitis  . toungue pain   Patient Active Problem List   Diagnosis Date Noted  . Back pain   . Gestational diabetes 07/15/2008  . BV (bacterial vaginosis) 09/14/2001   Prior to Admission medications   Not on File   Medications, allergies, past medical history, surgical history, family history, social history and problem list reviewed and updated.  HPI  [age] yof with PMH several bacterial vaginosis and vaginal yeast infections presents today with vaginal discharge and pruritis.   She was seen here 2.5 wks ago with vaginal dc and odor. Diag with bv and started on flagyl 7 day course. Wet prep was neg for yeast or trich. UA was neg. STD testing done and neg for HIV, syphilis, GC/CT, and Hep C.   She completed the 7 day flagyl course. Her sx resolved for about one week then returned. She had vaginal discharge beginning about 8-9 days ago which has slowly eased off past few days. She has also had vaginal itchiness past week. She denies any dysuria, freq, or urgency. No fever, chills, N/V, diarrhea.   She is sexually active with her husband of 14 yrs. No other partners. She states that she only got the std screening last visit because she was asked, she was not concerned about stds.   She also mentions white coating on her tongue that started about one week ago. Fully coated her tongue the first couple days and was able to wipe it off with a cloth. Has been less and less white each day but is still there. She mentions she is not tasting foods regularly and that her tongue feels somewhat "numb."    Review of Systems No CP, SOB, fever, chills. See HPI.     Objective:   Physical Exam  Constitutional: She appears well-developed and well-nourished.  Non-toxic appearance. She does not have a sickly appearance. She does not appear  ill. No distress.  BP 122/80 mmHg  Pulse 99  Temp(Src) 98 F (36.7 C) (Oral)  Resp 16  Ht 5\' 3"  (1.6 m)  Wt 150 lb (68.04 kg)  BMI 26.58 kg/m2  SpO2 99%  LMP 02/23/2014   HENT:  Mouth/Throat: Uvula is midline and oropharynx is clear and moist. No oropharyngeal exudate, posterior oropharyngeal edema or posterior oropharyngeal erythema.    6-8 white 1-592mm circular lesions noted toward back of tongue. Not scrapable. No erythema. No black discoloration. Not painful.   Genitourinary:  Wet prep swab obtained by self swab.    Results for orders placed or performed in visit on 03/08/14  POCT Wet Prep with KOH  Result Value Ref Range   Trichomonas, UA Negative    Clue Cells Wet Prep HPF POC 7-9    Epithelial Wet Prep HPF POC 5-7    Yeast Wet Prep HPF POC Neg    Bacteria Wet Prep HPF POC 2+    RBC Wet Prep HPF POC Neg    WBC Wet Prep HPF POC 2-5    KOH Prep POC Negative         Assessment & Plan:   [age] yof with PMH several bacterial vaginosis and vaginal yeast infections presents today with vaginal discharge and pruritis.   Vaginal discharge - Plan: POCT Wet Prep with KOH, fluconazole (DIFLUCAN) 150 MG tablet --wet prep  neg for yeast, trich, still some clue cells present --will tx for yeast infx due to sx and recent course of flagyl, pt has had yeast infxs following flagyl tx in the past --rtc if sx not resolving in few days  Tongue coating --Likely secondary to yeast infx after recent abx --diflucan may help --if not resolving in one week rtc --hiv neg 3 wks ago  Donnajean Lopesodd M. Scout Gumbs, PA-C Physician Assistant-Certified [facility]  03/08/2014 4:09 PM

## 2014-03-16 ENCOUNTER — Ambulatory Visit (INDEPENDENT_AMBULATORY_CARE_PROVIDER_SITE_OTHER): Payer: Managed Care, Other (non HMO) | Admitting: Sports Medicine

## 2014-03-16 VITALS — BP 122/74 | HR 74 | Temp 98.6°F | Resp 17 | Ht 62.5 in | Wt 151.0 lb

## 2014-03-16 DIAGNOSIS — R5382 Chronic fatigue, unspecified: Secondary | ICD-10-CM

## 2014-03-16 DIAGNOSIS — B9689 Other specified bacterial agents as the cause of diseases classified elsewhere: Secondary | ICD-10-CM

## 2014-03-16 DIAGNOSIS — N760 Acute vaginitis: Secondary | ICD-10-CM

## 2014-03-16 DIAGNOSIS — A499 Bacterial infection, unspecified: Secondary | ICD-10-CM

## 2014-03-16 DIAGNOSIS — N898 Other specified noninflammatory disorders of vagina: Secondary | ICD-10-CM

## 2014-03-16 LAB — POCT WET PREP WITH KOH
Clue Cells Wet Prep HPF POC: 25
KOH Prep POC: NEGATIVE
TRICHOMONAS UA: NEGATIVE
YEAST WET PREP PER HPF POC: NEGATIVE

## 2014-03-16 MED ORDER — CLINDAMYCIN HCL 300 MG PO CAPS: 300.0000 mg | ORAL_CAPSULE | Freq: Two times a day (BID) | ORAL | Status: DC

## 2014-03-16 MED ORDER — FLUCONAZOLE 150 MG PO TABS: 150.0000 mg | ORAL_TABLET | Freq: Once | ORAL | Status: DC

## 2014-03-16 NOTE — Progress Notes (Deleted)
Opened in error

## 2014-03-16 NOTE — Patient Instructions (Signed)
Bacterial Vaginosis Bacterial vaginosis is a vaginal infection that occurs when the normal balance of bacteria in the vagina is disrupted. It results from an overgrowth of certain bacteria. This is the most common vaginal infection in women of childbearing age. Treatment is important to prevent complications, especially in pregnant women, as it can cause a premature delivery. CAUSES  Bacterial vaginosis is caused by an increase in harmful bacteria that are normally present in smaller amounts in the vagina. Several different kinds of bacteria can cause bacterial vaginosis. However, the reason that the condition develops is not fully understood. RISK FACTORS Certain activities or behaviors can put you at an increased risk of developing bacterial vaginosis, including:  Having a new sex partner or multiple sex partners.  Douching.  Using an intrauterine device (IUD) for contraception. Women do not get bacterial vaginosis from toilet seats, bedding, swimming pools, or contact with objects around them. SIGNS AND SYMPTOMS  Some women with bacterial vaginosis have no signs or symptoms. Common symptoms include:  Grey vaginal discharge.  A fishlike odor with discharge, especially after sexual intercourse.  Itching or burning of the vagina and vulva.  Burning or pain with urination. DIAGNOSIS  Your health care provider will take a medical history and examine the vagina for signs of bacterial vaginosis. A sample of vaginal fluid may be taken. Your health care provider will look at this sample under a microscope to check for bacteria and abnormal cells. A vaginal pH test may also be done.  TREATMENT  Bacterial vaginosis may be treated with antibiotic medicines. These may be given in the form of a pill or a vaginal cream. A second round of antibiotics may be prescribed if the condition comes back after treatment.  HOME CARE INSTRUCTIONS   Only take over-the-counter or prescription medicines as  directed by your health care provider.  If antibiotic medicine was prescribed, take it as directed. Make sure you finish it even if you start to feel better.  Do not have sex until treatment is completed.  Tell all sexual partners that you have a vaginal infection. They should see their health care provider and be treated if they have problems, such as a mild rash or itching.  Practice safe sex by using condoms and only having one sex partner. SEEK MEDICAL CARE IF:   Your symptoms are not improving after 3 days of treatment.  You have increased discharge or pain.  You have a fever. MAKE SURE YOU:   Understand these instructions.  Will watch your condition.  Will get help right away if you are not doing well or get worse. FOR MORE INFORMATION  Centers for Disease Control and Prevention, Division of STD Prevention: www.cdc.gov/std American Sexual Health Association (ASHA): www.ashastd.org  Document Released: 04/02/2005 Document Revised: 01/21/2013 Document Reviewed: 11/12/2012 ExitCare Patient Information 2015 [employer]. This information is not intended to replace advice given to you by your health care provider. Make sure you discuss any questions you have with your health care provider.  

## 2014-03-16 NOTE — Progress Notes (Signed)
  Marissa Wagner - 33 y.o. female MRN [id number]  Date of birth: 09/17/1980  CC & HPI:  Chief Complaint  Patient presents with  . Follow-up    patient has been here three times in the last 3 weeks for a yeast infection and is still not better per patient   . Vaginitis    Pt is here to follow up: Vaginal discharge: Patient has been here 3 times in the last month for recurrent vaginal discharge, vaginal pruritus and mouth symptoms. She was empirically treated for oral candidiasis with one dose of Diflucan. She reports overall her mouth symptoms have somewhat improved and she no longer has the white speckled lesion she did previously and she does have a dysesthesia as well as scalloping of her tongue that is new for her past month. She is continuing to have vaginal discharge as well as odor.  She denies any fevers or chills but does report recurrent night sweats over the past 2 years. She has had approximately 20 pound weight gain over the past 3 years. She reports intermittent tachycardia palpitations. Denies any chest pain, shortness of breath, thinning of her hair or nails.  ROS:  Per HPI.   HISTORY: Past Medical, Surgical, Social, and Family History Reviewed & Updated per EMR.  Pertinent Historical Findings include: History of bilateral tubal ligation, no chronic medical conditions,   Historical Data Reviewed: HIV on 11/3 as well as routine STI labs were negative. No concerns for exposure. One sexual partner with her husband, monogamous relationship.   OBJECTIVE:  VS:   HT:5' 2.5" (158.8 cm)   WT:151 lb (68.493 kg)  BMI:27.2          BP:122/74 mmHg  HR:74bpm  TEMP:98.6 F (37 C)(Oral)  RESP:100 %  PHYSICAL EXAM: GENERAL:  adult African-American female. In no discomfort; no respiratory distress   PSYCH: alert and appropriate, good insight   HNEENT: mmm, no JVD Questionable thyromegaly without nodularity. No anterior cervical lymphadenopathy. Significantly scalloped tongue with no  evidence of satellite lesions or oral candidiasis   CARDIAC: RRR, S1/S2 heard, no murmur  LUNGS: CTA B, no wheezes, no crackles  EXTREM: Warm, well perfused.  Moves all 4 extremities spontaneously; no lateralization.    PELVIC EXAM: Chaperone: CMA present   External: no skin lesions, normal appearing genitalia, thick white copious discharge  Vagina: no vaginal lesion, vaginal mucosa moist   Cervix: no cervical lesions,   TESTS:   wet prep obtained    ASSESSMENT: 1. Chronic fatigue   2. Vaginal discharge   3. BV (bacterial vaginosis)    Multiple recurrent bacterial vaginosis infections likely not responding to Flagyl. Will try alternative treatment. Given fatigue, scalloped tongue, and questionable thyromegaly will check TSH and CBC.  PLAN: See problem based charting & AVS for additional documentation.  Clindamycin twice a day 1 week  Discussed appropriate sources of probiotics including natural yogurts and fermented foods. Okay to use probiotic supplement if she would like to.  Refill Diflucan for a likely antibiotic related candidiasis following clindamycin. Discussed also antibiotic related diarrhea risk.  Will have clinic call with abnormal results or I will call discuss further if abnormal results from blood work. > Return if symptoms worsen or fail to improve.

## 2014-03-17 LAB — CBC WITH DIFFERENTIAL/PLATELET
Basophils Absolute: 0 10*3/uL (ref 0.0–0.1)
Basophils Relative: 0 % (ref 0–1)
EOS ABS: 0.2 10*3/uL (ref 0.0–0.7)
Eosinophils Relative: 2 % (ref 0–5)
HEMATOCRIT: 38.6 % (ref 36.0–46.0)
HEMOGLOBIN: 13 g/dL (ref 12.0–15.0)
LYMPHS ABS: 3.2 10*3/uL (ref 0.7–4.0)
Lymphocytes Relative: 36 % (ref 12–46)
MCH: 31.1 pg (ref 26.0–34.0)
MCHC: 33.7 g/dL (ref 30.0–36.0)
MCV: 92.3 fL (ref 78.0–100.0)
MONO ABS: 0.7 10*3/uL (ref 0.1–1.0)
MONOS PCT: 8 % (ref 3–12)
MPV: 10.9 fL (ref 9.4–12.4)
NEUTROS ABS: 4.9 10*3/uL (ref 1.7–7.7)
NEUTROS PCT: 54 % (ref 43–77)
Platelets: 279 10*3/uL (ref 150–400)
RBC: 4.18 MIL/uL (ref 3.87–5.11)
RDW: 13.3 % (ref 11.5–15.5)
WBC: 9 10*3/uL (ref 4.0–10.5)

## 2014-03-17 LAB — TSH: TSH: 2.136 u[IU]/mL (ref 0.350–4.500)

## 2014-03-23 ENCOUNTER — Encounter: Payer: Self-pay | Admitting: Sports Medicine

## 2014-12-10 ENCOUNTER — Other Ambulatory Visit: Payer: Self-pay | Admitting: Obstetrics and Gynecology

## 2015-02-21 ENCOUNTER — Other Ambulatory Visit: Payer: Self-pay | Admitting: Obstetrics and Gynecology

## 2015-05-10 ENCOUNTER — Ambulatory Visit (INDEPENDENT_AMBULATORY_CARE_PROVIDER_SITE_OTHER): Payer: Managed Care, Other (non HMO) | Admitting: Family Medicine

## 2015-05-10 VITALS — BP 112/78 | HR 80 | Temp 98.4°F | Resp 14 | Ht 63.0 in | Wt 158.8 lb

## 2015-05-10 DIAGNOSIS — L0291 Cutaneous abscess, unspecified: Secondary | ICD-10-CM | POA: Diagnosis not present

## 2015-05-10 DIAGNOSIS — L0390 Cellulitis, unspecified: Secondary | ICD-10-CM

## 2015-05-10 DIAGNOSIS — L089 Local infection of the skin and subcutaneous tissue, unspecified: Secondary | ICD-10-CM | POA: Diagnosis not present

## 2015-05-10 MED ORDER — FLUCONAZOLE 150 MG PO TABS: 150.0000 mg | ORAL_TABLET | Freq: Once | ORAL | Status: AC

## 2015-05-10 MED ORDER — DOXYCYCLINE HYCLATE 100 MG PO TABS: 100.0000 mg | ORAL_TABLET | Freq: Two times a day (BID) | ORAL | Status: AC

## 2015-05-10 NOTE — Progress Notes (Signed)
 Chief Complaint:  Chief Complaint  Patient presents with  . Cyst    Lower back    HPI: Marissa Wagner is a 35 y.o. female who reports to [facility] today complaining of 3 day histor y of back abscess , no fevers, + chills.Has had this before several years ago s/p ID .  She has not had one since. No hx of MRSA. No redness, She was able to express a lot of purulent dc out. Again no fevers or chills.   Past Medical History  Diagnosis Date  . Gestational diabetes 07/2008  . Anemia 2010    During pregnancy  . H/O varicella   . Dysuria   . Back pain   . 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 BV (bacterial vaginosis) 09/2001   Past Surgical History  Procedure Laterality Date  . Tubal ligation  08/09/2008    Bilateral   . Broken arm    . Other surgical history      ARM SURGERY   Social History   Social History  . Marital Status: Married    Spouse Name: N/A  . Number of Children: N/A  . Years of Education: N/A   Social History Main Topics  . Smoking status: Never Smoker   . Smokeless tobacco: Never Used  . Alcohol Use: No  . Drug Use: No  . Sexual Activity: Yes    Birth Control/ Protection: Other-see comments     Comment: BTL, married, one partner past 14 yrs   Other Topics Concern  . None   Social History Narrative   Family History  Problem Relation Age of Onset  . Cancer Maternal Grandmother     breast  . Heart disease Maternal Grandmother    No Known Allergies Prior to Admission medications   Medication Sig Start Date End Date Taking? Authorizing Provider  clindamycin (CLEOCIN) 300 MG capsule Take 1 capsule (300 mg total) by mouth 2 (two) times daily. Patient not taking: Reported on 05/10/2015 03/16/14   Andrena Mews, DO  fluconazole (DIFLUCAN) 150 MG tablet Take 1 tablet (150 mg total) by mouth once. Take after completing Clindamycin Course Patient not taking: Reported on 05/10/2015 03/16/14   Andrena Mews, DO     ROS: The patient denies fevers, chills, night  sweats, unintentional weight loss, chest pain, palpitations, wheezing, dyspnea on exertion, nausea, vomiting, abdominal pain, dysuria, hematuria, melena, numbness, weakness, or tingling.   All other systems have been reviewed and were otherwise negative with the exception of those mentioned in the HPI and as above.    PHYSICAL EXAM: Filed Vitals:   05/10/15 1613  BP: 112/78  Pulse: 80  Temp: 98.4 F (36.9 C)  Resp: 14   Body mass index is 28.14 kg/(m^2).   General: Alert, no acute distress HEENT:  Normocephalic, atraumatic, oropharynx patent. EOMI, PERRLA Cardiovascular:  Regular rate and rhythm, no rubs murmurs or gallops.  Respiratory: Clear to auscultation bilaterally.  No wheezes, rales, or rhonchi.  No cyanosis, no use of accessory musculature Abdominal: No organomegaly, abdomen is soft and non-tender, positive bowel sounds. No masses. Skin: + left lower abscess with dc, no erythema, minimal tenderness,  Neurologic: Facial musculature symmetric. Psychiatric: Patient acts appropriately throughout our interaction. Lymphatic: No cervical or submandibular lymphadenopathy Musculoskeletal: Gait intact. No edema, tenderness   LABS:    EKG/XRAY:   Primary read interpreted by Dr. Conley Rolls at [facility].   ASSESSMENT/PLAN: Encounter Diagnoses  Name Primary?  . Skin infection Yes  .  Cellulitis and abscess    Rx Doxycycline Warm compresses Precautions given, not able to express fluids at area, she will return if abscess becomes worse or more fluctuant Fu prm   Gross sideeffects, risk and benefits, and alternatives of medications d/w patient. Patient is aware that all medications have potential sideeffects and we are unable to predict every sideeffect or drug-drug interaction that may occur.    DO  05/10/2015 4:34 PM

## 2015-05-10 NOTE — Patient Instructions (Signed)

## 2017-08-24 ENCOUNTER — Emergency Department (HOSPITAL_COMMUNITY): Payer: BLUE CROSS/BLUE SHIELD

## 2017-08-24 ENCOUNTER — Encounter (HOSPITAL_COMMUNITY): Payer: Self-pay | Admitting: *Deleted

## 2017-08-24 ENCOUNTER — Other Ambulatory Visit: Payer: Self-pay

## 2017-08-24 ENCOUNTER — Emergency Department (HOSPITAL_COMMUNITY)
Admission: EM | Admit: 2017-08-24 | Discharge: 2017-08-24 | Disposition: A | Discharge status: Home / Self Care | Payer: BLUE CROSS/BLUE SHIELD | Attending: Emergency Medicine | Admitting: Emergency Medicine

## 2017-08-24 DIAGNOSIS — Z79899 Other long term (current) drug therapy: Secondary | ICD-10-CM | POA: Diagnosis not present

## 2017-08-24 DIAGNOSIS — R079 Chest pain, unspecified: Secondary | ICD-10-CM | POA: Insufficient documentation

## 2017-08-24 LAB — COMPREHENSIVE METABOLIC PANEL
ALT: 20 U/L (ref 14–54)
AST: 22 U/L (ref 15–41)
Albumin: 3.7 g/dL (ref 3.5–5.0)
Alkaline Phosphatase: 71 U/L (ref 38–126)
Anion gap: 8 (ref 5–15)
BUN: 10 mg/dL (ref 6–20)
CO2: 24 mmol/L (ref 22–32)
Calcium: 8.8 mg/dL — ABNORMAL LOW (ref 8.9–10.3)
Chloride: 107 mmol/L (ref 101–111)
Creatinine, Ser: 1.08 mg/dL — ABNORMAL HIGH (ref 0.44–1.00)
GFR calc Af Amer: >60 mL/min (ref >60)
GFR calc non Af Amer: >60 mL/min (ref >60)
Glucose, Bld: 102 mg/dL — ABNORMAL HIGH (ref 65–99)
Potassium: 4 mmol/L (ref 3.5–5.1)
Sodium: 139 mmol/L (ref 135–145)
Total Bilirubin: 0.9 mg/dL (ref 0.3–1.2)
Total Protein: 6.8 g/dL (ref 6.5–8.1)

## 2017-08-24 LAB — CBC
HCT: 37.9 % (ref 36.0–46.0)
HEMOGLOBIN: 12.2 g/dL (ref 12.0–15.0)
MCH: 30.7 pg (ref 26.0–34.0)
MCHC: 32.2 g/dL (ref 30.0–36.0)
MCV: 95.5 fL (ref 78.0–100.0)
Platelets: 262 10*3/uL (ref 150–400)
RBC: 3.97 MIL/uL (ref 3.87–5.11)
RDW: 12.6 % (ref 11.5–15.5)
WBC: 6.4 10*3/uL (ref 4.0–10.5)

## 2017-08-24 LAB — I-STAT TROPONIN, ED
TROPONIN I, POC: 0 ng/mL (ref 0.00–0.08)
Troponin i, poc: 0 ng/mL (ref 0.00–0.08)

## 2017-08-24 LAB — I-STAT BETA HCG BLOOD, ED (MC, WL, AP ONLY): I-stat hCG, quantitative: <5 m[IU]/mL (ref <5)

## 2017-08-24 LAB — LIPASE, BLOOD: Lipase: 34 U/L (ref 11–51)

## 2017-08-24 NOTE — Discharge Instructions (Addendum)
Please follow-up and establish care with a primary care provider by calling the number circled on your discharge paperwork.  Please return to the emergency department if you develop any new or worsening symptoms.

## 2017-08-24 NOTE — ED Notes (Signed)
Pt states having labs already drawn in triage.

## 2017-08-24 NOTE — ED Notes (Signed)
Patient transported to X-ray 

## 2017-08-24 NOTE — ED Triage Notes (Signed)
The pt woke up at 0430 with mid chest pain sl sob none now  Hx of chest pain  Several years ago non-cardiac  lmp April 30th

## 2017-08-24 NOTE — ED Provider Notes (Signed)
[facility] EMERGENCY DEPARTMENT Provider Note   CSN: [id number] Arrival date & time: 08/24/17  0531     History   Chief Complaint Chief Complaint  Patient presents with  . Chest Pain    HPI Marissa Wagner is a 37 y.o. female who is previously healthy who presents with chest pain and shortness of breath that is now resolved.  Patient reports she woke up from sleep this morning with a sharp central chest pain.  She had some associated shortness of breath at the time.  The pain did not radiate.  She had no associated nausea, vomiting.  The pain lasted for about 30 minutes and was constant.  She has returned to baseline.  She reports after having some abdominal pain, however that is now resolved as well.  She did not take any medications prior to arrival.  She denies any recent long trips, surgeries, known cancer, history of blood clots, exogenous estrogen use, new leg pain or swelling, pleuritic pain.  She reports she had some associated coughing related to the episode, but not prior to today.  No fevers at home.  HPI  Past Medical History:  Diagnosis Date  . Anemia 2010   During pregnancy  . Back pain   . BV (bacterial vaginosis) 09/2001  . Dysuria   . Gestational diabetes 07/2008  . H/O varicella   . Vulvar pruritus 11/02/09    Patient Active Problem List   Diagnosis Date Noted  . Back pain   . Gestational diabetes 07/15/2008  . BV (bacterial vaginosis) 09/14/2001    Past Surgical History:  Procedure Laterality Date  . BROKEN ARM    . OTHER SURGICAL HISTORY     ARM SURGERY  . TUBAL LIGATION  08/09/2008   Bilateral      OB History    Gravida  5   Para  2   Term  2   Preterm      AB  3   Living  2     SAB  2   TAB  1   Ectopic      Multiple      Live Births  2            Home Medications    Prior to Admission medications   Medication Sig Start Date End Date Taking? Authorizing Provider  cetirizine (ZYRTEC) 10 MG tablet  Take 10 mg by mouth daily.   Yes [provider]  cholecalciferol (VITAMIN D) 1000 units tablet Take 1,000 Units by mouth daily.   Yes [provider]  Cyanocobalamin (VITAMIN B 12 PO) Take 1 tablet by mouth daily.   Yes [provider]  doxycycline (VIBRA-TABS) 100 MG tablet Take 1 tablet (100 mg total) by mouth 2 (two) times daily. Patient not taking: Reported on 08/24/2017 05/10/15   Le, Thao P, DO  fluconazole (DIFLUCAN) 150 MG tablet Take 1 tablet (150 mg total) by mouth once. May repeat 3 days as needed Patient not taking: Reported on 08/24/2017 05/10/15   Lenell Antu, DO    Family History Family History  Problem Relation Age of Onset  . Cancer Maternal Grandmother        breast  . Heart disease Maternal Grandmother     Social History Social History   Tobacco Use  . Smoking status: Never Smoker  . Smokeless tobacco: Never Used  Substance Use Topics  . Alcohol use: No  . Drug use: No  Allergies   Patient has no known allergies.   Review of Systems Review of Systems  Constitutional: Negative for chills and fever.  HENT: Negative for facial swelling and sore throat.   Respiratory: Positive for cough and shortness of breath.   Cardiovascular: Positive for chest pain. Negative for leg swelling.  Gastrointestinal: Positive for abdominal pain. Negative for nausea and vomiting.  Genitourinary: Negative for dysuria.  Musculoskeletal: Negative for back pain.  Skin: Negative for rash and wound.  Neurological: Negative for headaches.  Psychiatric/Behavioral: The patient is not nervous/anxious.      Physical Exam Updated Vital Signs BP 100/73   Pulse 74   Temp 98.2 F (36.8 C) (Oral)   Resp 16   Ht  (1.575 m)   Wt 76.7 kg (169 lb)   LMP 08/13/2017   SpO2 100%   BMI 30.91 kg/m   Physical Exam  Constitutional: She appears well-developed and well-nourished. No distress.  HENT:  Head: Normocephalic and atraumatic.  Mouth/Throat:  Oropharynx is clear and moist. No oropharyngeal exudate.  Eyes: Pupils are equal, round, and reactive to light. Conjunctivae are normal. Right eye exhibits no discharge. Left eye exhibits no discharge. No scleral icterus.  Neck: Normal range of motion. Neck supple. No thyromegaly present.  Cardiovascular: Normal rate, regular rhythm, normal heart sounds and intact distal pulses. Exam reveals no gallop and no friction rub.  No murmur heard. Pulmonary/Chest: Effort normal and breath sounds normal. No stridor. No respiratory distress. She has no wheezes. She has no rales. She exhibits no tenderness.  Abdominal: Soft. Bowel sounds are normal. She exhibits no distension. There is no tenderness. There is no rebound and no guarding.  Musculoskeletal: She exhibits no edema.  Lymphadenopathy:    She has no cervical adenopathy.  Neurological: She is alert. Coordination normal.  Skin: Skin is warm and dry. No rash noted. She is not diaphoretic. No pallor.  Psychiatric: She has a normal mood and affect.  Nursing note and vitals reviewed.    ED Treatments / Results  Labs (all labs ordered are listed, but only abnormal results are displayed) Labs Reviewed  COMPREHENSIVE METABOLIC PANEL - Abnormal; Notable for the following components:      Result Value   Glucose, Bld 102 (*)    Creatinine, Ser 1.08 (*)    Calcium 8.8 (*)    All other components within normal limits  CBC  LIPASE, BLOOD  I-STAT TROPONIN, ED  I-STAT BETA HCG BLOOD, ED ([facility], [facility], [facility] ONLY)  I-STAT TROPONIN, ED    EKG EKG Interpretation  Date/Time:  Saturday Aug 24 2017 05:39:08 EDT Ventricular Rate:  93 PR Interval:  136 QRS Duration: 76 QT Interval:  344 QTC Calculation: 427 R Axis:   76 Text Interpretation:  Normal sinus rhythm Normal ECG Confirmed by Jacalyn Lefevre [phone]) on 08/24/2017 12:48:00 PM   Radiology Dg Chest 2 View  Result Date: 08/24/2017 CLINICAL DATA:  Mid in bilateral chest pain EXAM: CHEST - 2 VIEW  COMPARISON:  April 18, 2012 FINDINGS: The heart size and mediastinal contours are within normal limits. Both lungs are clear. The visualized skeletal structures are unremarkable. IMPRESSION: No active cardiopulmonary disease. Electronically Signed   By: Gerome Sam III M.D   On: 08/24/2017 09:53    Procedures Procedures (including critical care time)  Medications Ordered in ED Medications - No data to display   Initial Impression / Assessment and Plan / ED Course  I have reviewed the triage vital signs and the  nursing notes.  Pertinent labs & imaging results that were available during my care of the patient were reviewed by me and considered in my medical decision making (see chart for details).     Patient with a 30-minute episode of sharp chest pain resolved and patient is back to baseline in the ED.  She is very well-appearing with stable vitals.  Labs are unremarkable.  Delta troponin is negative.  EKG shows NSR.  Chest x-ray is negative.  PERC negative.  Low suspicion for PE or ACS.  Suspect GI or anxiety component.  Will refer to establish care with PCP for further work-up.  Return precautions discussed.  Patient understands and agrees with plan.  Patient vitals stable throughout ED course and discharged in satisfactory condition.  Final Clinical Impressions(s) / ED Diagnoses   Final diagnoses:  Nonspecific chest pain    ED Discharge Orders    None       Emi Holes, PA-C 08/24/17 1510    Jacalyn Lefevre, MD 08/24/17 [phone]

## 2017-08-24 NOTE — ED Notes (Signed)
Pt stable, ambulatory, and verbalizes understanding of d/c instructions.  

## 2019-05-01 ENCOUNTER — Ambulatory Visit: Payer: Self-pay | Attending: Internal Medicine

## 2019-05-01 ENCOUNTER — Other Ambulatory Visit: Payer: BLUE CROSS/BLUE SHIELD

## 2019-05-01 DIAGNOSIS — Z20822 Contact with and (suspected) exposure to covid-19: Secondary | ICD-10-CM

## 2019-05-02 LAB — NOVEL CORONAVIRUS, NAA: SARS-CoV-2, NAA: NOT DETECTED

## 2019-12-01 IMAGING — DX DG CHEST 2V
2 series · 2 of 2 positions shown · non-contrast
Comparison: April 18, 2012

CLINICAL DATA: Mid in bilateral chest pain

EXAM:
CHEST - 2 VIEW

[w chest pa]
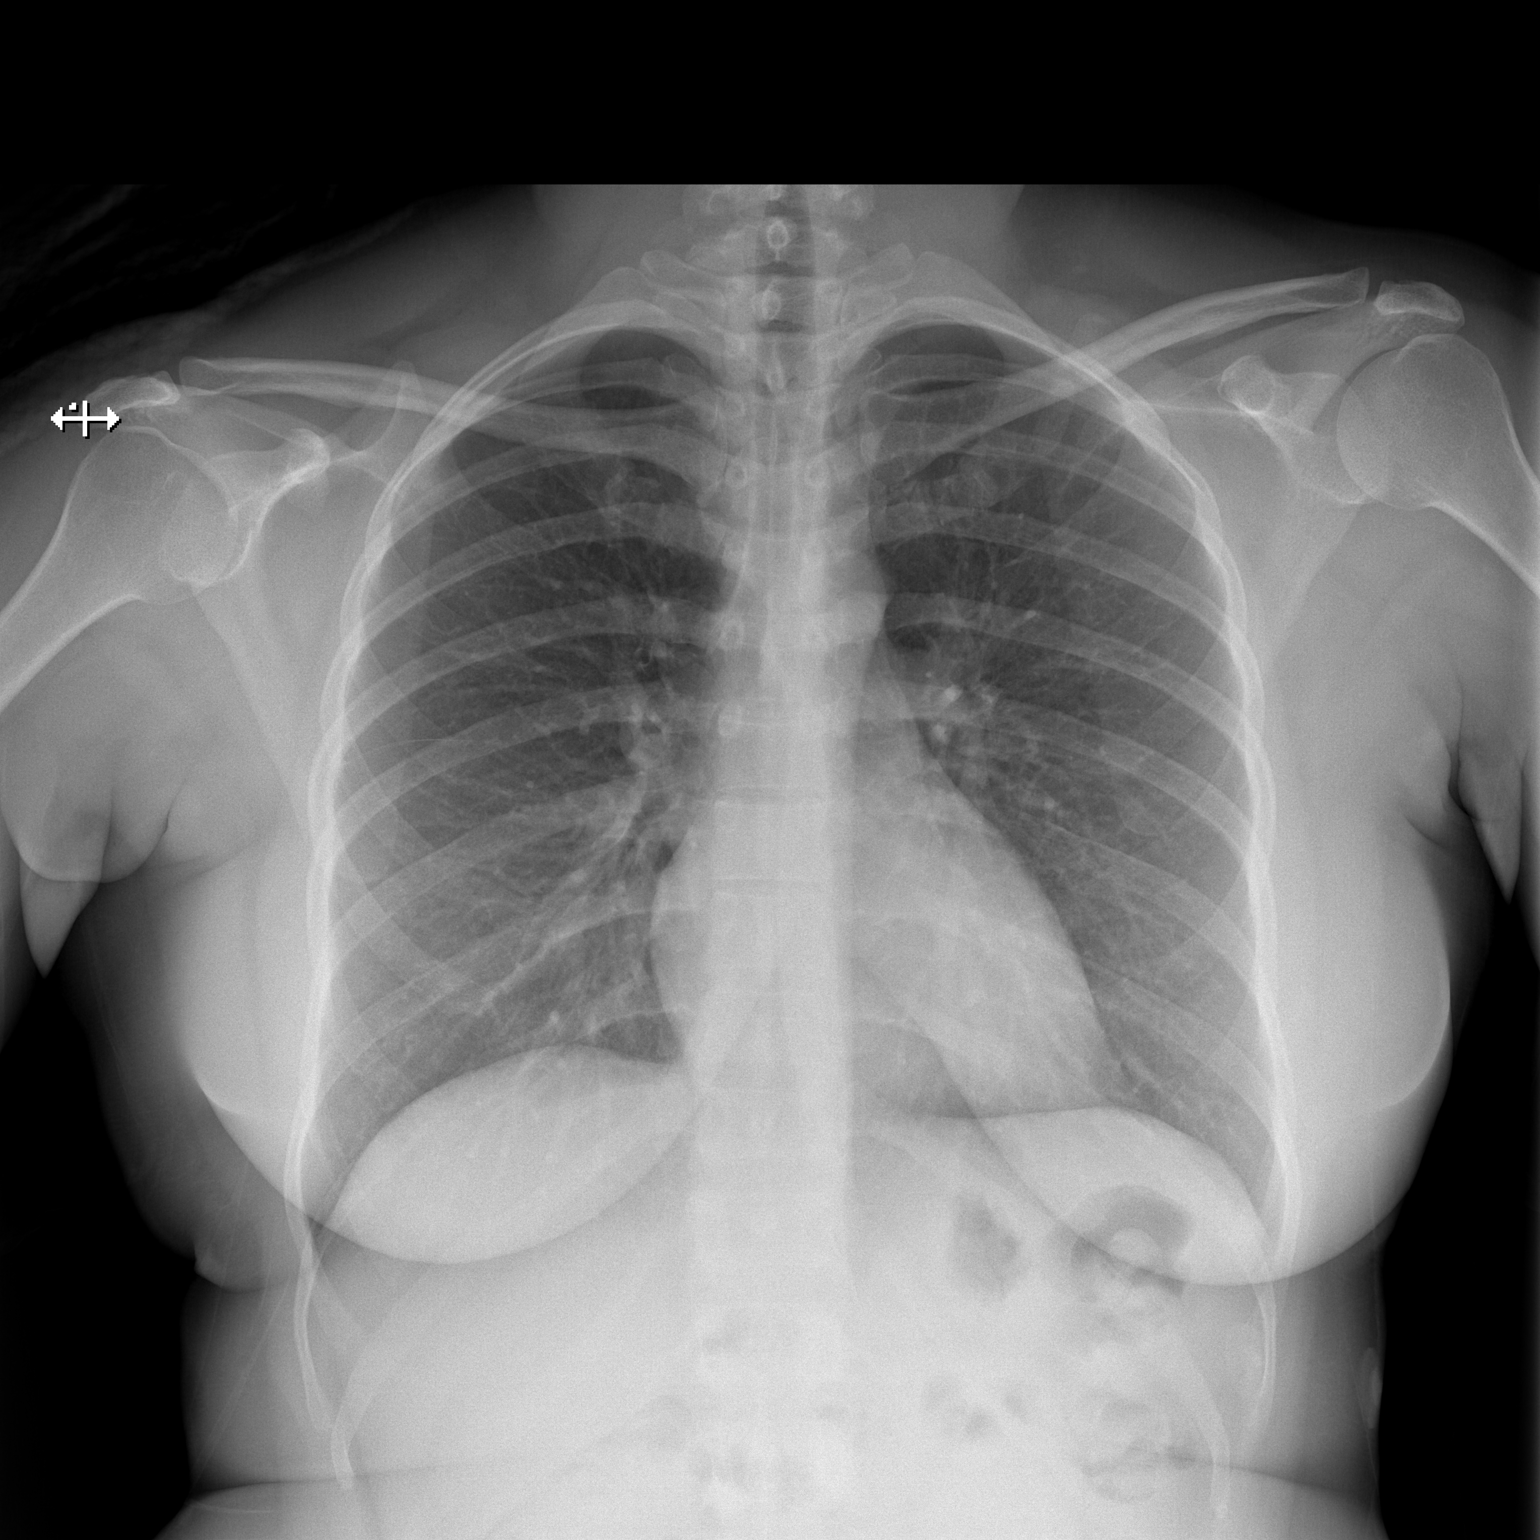

[w chest lat]
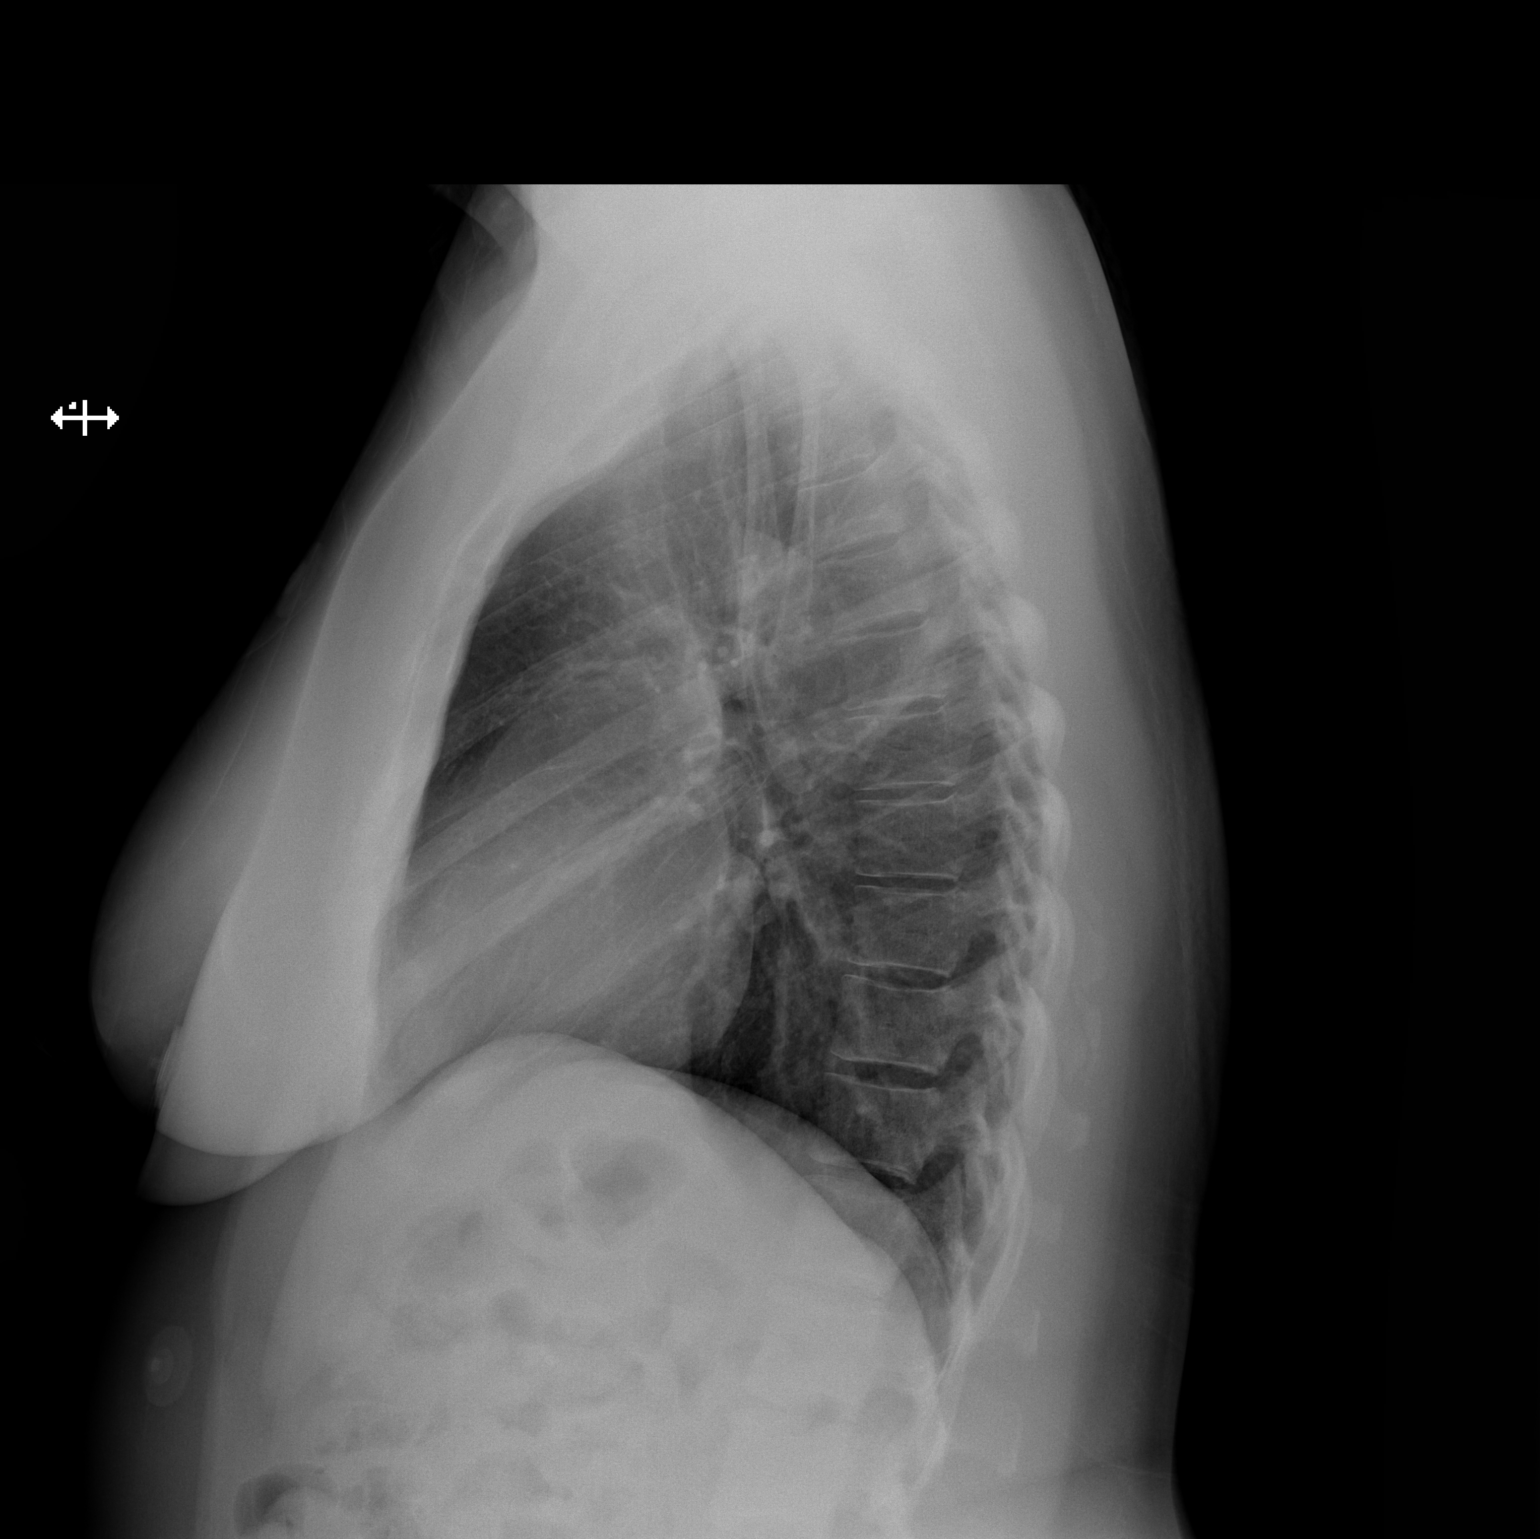

[2 of 2 positions shown; findings below may reference images not displayed]

FINDINGS: The heart size and mediastinal contours are within normal limits.
Both lungs are clear. The visualized skeletal structures are
unremarkable.
IMPRESSION: No active cardiopulmonary disease.

## 2021-11-02 DIAGNOSIS — F411 Generalized anxiety disorder: Secondary | ICD-10-CM | POA: Diagnosis not present

## 2021-11-03 DIAGNOSIS — Z1231 Encounter for screening mammogram for malignant neoplasm of breast: Secondary | ICD-10-CM | POA: Diagnosis not present

## 2021-11-03 DIAGNOSIS — Z01419 Encounter for gynecological examination (general) (routine) without abnormal findings: Secondary | ICD-10-CM | POA: Diagnosis not present

## 2021-11-03 DIAGNOSIS — N762 Acute vulvitis: Secondary | ICD-10-CM | POA: Diagnosis not present

## 2021-11-03 DIAGNOSIS — Z8742 Personal history of other diseases of the female genital tract: Secondary | ICD-10-CM | POA: Diagnosis not present

## 2021-11-03 DIAGNOSIS — Z0142 Encounter for cervical smear to confirm findings of recent normal smear following initial abnormal smear: Secondary | ICD-10-CM | POA: Diagnosis not present

## 2021-11-03 DIAGNOSIS — Z113 Encounter for screening for infections with a predominantly sexual mode of transmission: Secondary | ICD-10-CM | POA: Diagnosis not present

## 2021-11-03 DIAGNOSIS — Z139 Encounter for screening, unspecified: Secondary | ICD-10-CM | POA: Diagnosis not present

## 2021-12-28 DIAGNOSIS — F411 Generalized anxiety disorder: Secondary | ICD-10-CM | POA: Diagnosis not present

## 2022-07-05 DIAGNOSIS — F419 Anxiety disorder, unspecified: Secondary | ICD-10-CM | POA: Diagnosis not present

## 2022-08-23 DIAGNOSIS — F411 Generalized anxiety disorder: Secondary | ICD-10-CM | POA: Diagnosis not present

## 2022-09-13 DIAGNOSIS — F411 Generalized anxiety disorder: Secondary | ICD-10-CM | POA: Diagnosis not present

## 2022-09-27 DIAGNOSIS — F411 Generalized anxiety disorder: Secondary | ICD-10-CM | POA: Diagnosis not present

## 2023-04-22 DIAGNOSIS — R1031 Right lower quadrant pain: Secondary | ICD-10-CM | POA: Diagnosis not present

## 2023-04-22 DIAGNOSIS — N760 Acute vaginitis: Secondary | ICD-10-CM | POA: Diagnosis not present

## 2023-04-22 DIAGNOSIS — R1032 Left lower quadrant pain: Secondary | ICD-10-CM | POA: Diagnosis not present

## 2023-04-22 DIAGNOSIS — N898 Other specified noninflammatory disorders of vagina: Secondary | ICD-10-CM | POA: Diagnosis not present

## 2023-04-22 DIAGNOSIS — Z3202 Encounter for pregnancy test, result negative: Secondary | ICD-10-CM | POA: Diagnosis not present

## 2023-04-22 DIAGNOSIS — R300 Dysuria: Secondary | ICD-10-CM | POA: Diagnosis not present

## 2023-04-25 DIAGNOSIS — N762 Acute vulvitis: Secondary | ICD-10-CM | POA: Diagnosis not present

## 2023-04-25 DIAGNOSIS — N760 Acute vaginitis: Secondary | ICD-10-CM | POA: Diagnosis not present

## 2023-04-25 DIAGNOSIS — R58 Hemorrhage, not elsewhere classified: Secondary | ICD-10-CM | POA: Diagnosis not present

## 2023-04-25 DIAGNOSIS — E559 Vitamin D deficiency, unspecified: Secondary | ICD-10-CM | POA: Diagnosis not present

## 2023-05-13 DIAGNOSIS — R7303 Prediabetes: Secondary | ICD-10-CM | POA: Diagnosis not present

## 2023-05-24 DIAGNOSIS — R1030 Lower abdominal pain, unspecified: Secondary | ICD-10-CM | POA: Diagnosis not present

## 2023-05-24 DIAGNOSIS — Z1329 Encounter for screening for other suspected endocrine disorder: Secondary | ICD-10-CM | POA: Diagnosis not present

## 2023-05-24 DIAGNOSIS — D696 Thrombocytopenia, unspecified: Secondary | ICD-10-CM | POA: Diagnosis not present

## 2023-08-05 DIAGNOSIS — R7303 Prediabetes: Secondary | ICD-10-CM | POA: Diagnosis not present

## 2023-08-05 DIAGNOSIS — Z0000 Encounter for general adult medical examination without abnormal findings: Secondary | ICD-10-CM | POA: Diagnosis not present

## 2023-08-12 DIAGNOSIS — R300 Dysuria: Secondary | ICD-10-CM | POA: Diagnosis not present

## 2023-08-12 DIAGNOSIS — Z0000 Encounter for general adult medical examination without abnormal findings: Secondary | ICD-10-CM | POA: Diagnosis not present

## 2023-08-12 DIAGNOSIS — R7303 Prediabetes: Secondary | ICD-10-CM | POA: Diagnosis not present

## 2023-09-24 DIAGNOSIS — R140 Abdominal distension (gaseous): Secondary | ICD-10-CM | POA: Diagnosis not present

## 2023-09-24 DIAGNOSIS — Z1211 Encounter for screening for malignant neoplasm of colon: Secondary | ICD-10-CM | POA: Diagnosis not present

## 2023-09-24 DIAGNOSIS — K5904 Chronic idiopathic constipation: Secondary | ICD-10-CM | POA: Diagnosis not present
